# Patient Record
Sex: Female | Born: 1985 | Race: White | Hispanic: No | Marital: Single | Smoking: Never smoker
Health system: Southern US, Community
[De-identification: ages and names within clinical notes are randomized; demographics above are authoritative.]

## PROBLEM LIST (undated history)

## (undated) DIAGNOSIS — K219 Gastro-esophageal reflux disease without esophagitis: Secondary | ICD-10-CM

## (undated) DIAGNOSIS — T7840XA Allergy, unspecified, initial encounter: Secondary | ICD-10-CM

## (undated) DIAGNOSIS — R87810 Cervical high risk human papillomavirus (HPV) DNA test positive: Secondary | ICD-10-CM

## (undated) DIAGNOSIS — IMO0002 Reserved for concepts with insufficient information to code with codable children: Secondary | ICD-10-CM

## (undated) DIAGNOSIS — R87610 Atypical squamous cells of undetermined significance on cytologic smear of cervix (ASC-US): Secondary | ICD-10-CM

## (undated) DIAGNOSIS — S34101A Unspecified injury to L1 level of lumbar spinal cord, initial encounter: Secondary | ICD-10-CM

## (undated) HISTORY — DX: Allergy, unspecified, initial encounter: T78.40XA

## (undated) HISTORY — DX: Gastro-esophageal reflux disease without esophagitis: K21.9

## (undated) HISTORY — DX: Atypical squamous cells of undetermined significance on cytologic smear of cervix (ASC-US): R87.610

## (undated) HISTORY — DX: Cervical high risk human papillomavirus (HPV) DNA test positive: R87.810

## (undated) HISTORY — PX: COLPOSCOPY: SHX161

## (undated) HISTORY — DX: Reserved for concepts with insufficient information to code with codable children: IMO0002

## (undated) HISTORY — DX: Unspecified injury to L1 level of lumbar spinal cord, initial encounter: S34.101A

## (undated) HISTORY — PX: WISDOM TOOTH EXTRACTION: SHX21

---

## 2007-04-13 DIAGNOSIS — R87619 Unspecified abnormal cytological findings in specimens from cervix uteri: Secondary | ICD-10-CM

## 2007-04-13 DIAGNOSIS — IMO0002 Reserved for concepts with insufficient information to code with codable children: Secondary | ICD-10-CM

## 2007-04-13 HISTORY — DX: Unspecified abnormal cytological findings in specimens from cervix uteri: R87.619

## 2007-04-13 HISTORY — DX: Reserved for concepts with insufficient information to code with codable children: IMO0002

## 2007-10-27 ENCOUNTER — Other Ambulatory Visit: Admission: RE | Admit: 2007-10-27 | Discharge: 2007-10-27 | Payer: Self-pay | Admitting: Obstetrics & Gynecology

## 2008-03-28 ENCOUNTER — Other Ambulatory Visit: Admission: RE | Admit: 2008-03-28 | Discharge: 2008-03-28 | Payer: Self-pay | Admitting: Obstetrics and Gynecology

## 2008-08-26 ENCOUNTER — Other Ambulatory Visit: Admission: RE | Admit: 2008-08-26 | Discharge: 2008-08-26 | Payer: Self-pay | Admitting: Obstetrics and Gynecology

## 2009-03-19 ENCOUNTER — Other Ambulatory Visit: Admission: RE | Admit: 2009-03-19 | Discharge: 2009-03-19 | Payer: Self-pay | Admitting: Obstetrics and Gynecology

## 2009-08-26 ENCOUNTER — Inpatient Hospital Stay (HOSPITAL_COMMUNITY): Admission: EM | Admit: 2009-08-26 | Discharge: 2009-09-01 | Payer: Self-pay | Admitting: Emergency Medicine

## 2009-12-13 HISTORY — PX: CERVICAL BIOPSY  W/ LOOP ELECTRODE EXCISION: SUR135

## 2011-03-19 LAB — CBC
HCT: 41.1 % (ref 36.0–46.0)
Hemoglobin: 13.9 g/dL (ref 12.0–15.0)
Hemoglobin: 13.2 g/dL (ref 12.0–15.0)
MCHC: 33.8 g/dL (ref 30.0–36.0)
MCHC: 34.4 g/dL (ref 30.0–36.0)
RDW: 12.5 % (ref 11.5–15.5)
RDW: 12.4 % (ref 11.5–15.5)

## 2011-03-19 LAB — PROTIME-INR: Prothrombin Time: 13.6 seconds (ref 11.6–15.2)

## 2011-03-19 LAB — LACTIC ACID, PLASMA: Lactic Acid, Venous: 1.3 mmol/L (ref 0.5–2.2)

## 2011-12-15 ENCOUNTER — Ambulatory Visit: Payer: Self-pay

## 2012-06-13 ENCOUNTER — Telehealth: Payer: Self-pay

## 2012-06-13 NOTE — Telephone Encounter (Signed)
Request printed since these records were not done in Epic.

## 2012-06-13 NOTE — Telephone Encounter (Signed)
PT STATES SHE HAD SOME SHOT RECORDS DONE FROM [place] THAT WE DID AND SHE WOULD LIKE TO HAVE A PRINT OUT OF WHAT WAS DONE PLEASE CALL [phone]

## 2012-09-05 ENCOUNTER — Ambulatory Visit: Payer: Self-pay

## 2012-09-26 ENCOUNTER — Ambulatory Visit: Payer: Self-pay

## 2012-10-09 ENCOUNTER — Ambulatory Visit: Payer: Self-pay | Admitting: Family Medicine

## 2012-10-19 ENCOUNTER — Encounter: Payer: Self-pay | Admitting: Family Medicine

## 2012-10-19 ENCOUNTER — Ambulatory Visit (INDEPENDENT_AMBULATORY_CARE_PROVIDER_SITE_OTHER): Payer: 59 | Admitting: Family Medicine

## 2012-10-19 VITALS — BP 119/72 | HR 75 | Temp 98.3°F | Resp 16 | Ht 64.0 in | Wt 138.0 lb

## 2012-10-19 DIAGNOSIS — R590 Localized enlarged lymph nodes: Secondary | ICD-10-CM

## 2012-10-19 DIAGNOSIS — Z3042 Encounter for surveillance of injectable contraceptive: Secondary | ICD-10-CM | POA: Insufficient documentation

## 2012-10-19 DIAGNOSIS — H6690 Otitis media, unspecified, unspecified ear: Secondary | ICD-10-CM

## 2012-10-19 DIAGNOSIS — R599 Enlarged lymph nodes, unspecified: Secondary | ICD-10-CM

## 2012-10-19 DIAGNOSIS — Z0000 Encounter for general adult medical examination without abnormal findings: Secondary | ICD-10-CM | POA: Insufficient documentation

## 2012-10-19 DIAGNOSIS — H6692 Otitis media, unspecified, left ear: Secondary | ICD-10-CM

## 2012-10-19 LAB — COMPREHENSIVE METABOLIC PANEL
AST: 16 U/L (ref 0–37)
BUN: 10 mg/dL (ref 6–23)
Calcium: 9.4 mg/dL (ref 8.4–10.5)
Chloride: 106 mEq/L (ref 96–112)
Creat: 0.81 mg/dL (ref 0.50–1.10)
Glucose, Bld: 83 mg/dL (ref 70–99)

## 2012-10-19 LAB — CBC
HCT: 40.3 % (ref 36.0–46.0)
Hemoglobin: 14.3 g/dL (ref 12.0–15.0)
MCHC: 35.5 g/dL (ref 30.0–36.0)
RBC: 4.52 MIL/uL (ref 3.87–5.11)

## 2012-10-19 MED ORDER — AMOXICILLIN-POT CLAVULANATE 875-125 MG PO TABS: 1.0000 | ORAL_TABLET | Freq: Two times a day (BID) | ORAL | Status: DC

## 2012-10-19 NOTE — Patient Instructions (Addendum)
Lymphadenopathy Lymphadenopathy means "disease of the lymph glands." But the term is usually used to describe swollen or enlarged lymph glands, also called lymph nodes. These are the bean-shaped organs found in many locations including the neck, underarm, and groin. Lymph glands are part of the immune system, which fights infections in your body. Lymphadenopathy can occur in just one area of the body, such as the neck, or it can be generalized, with lymph node enlargement in several areas. The nodes found in the neck are the most common sites of lymphadenopathy. CAUSES  When your immune system responds to germs (such as viruses or bacteria ), infection-fighting cells and fluid build up. This causes the glands to grow in size. This is usually not something to worry about. Sometimes, the glands themselves can become infected and inflamed. This is called lymphadenitis. Enlarged lymph nodes can be caused by many diseases:  Bacterial disease, such as strep throat or a skin infection.  Viral disease, such as a common cold.  Other germs, such as lyme disease, tuberculosis, or sexually transmitted diseases.  Cancers, such as lymphoma (cancer of the lymphatic system) or leukemia (cancer of the white blood cells).  Inflammatory diseases such as lupus or rheumatoid arthritis.  Reactions to medications. Many of the diseases above are rare, but important. This is why you should see your caregiver if you have lymphadenopathy. SYMPTOMS   Swollen, enlarged lumps in the neck, back of the head or other locations.  Tenderness.  Warmth or redness of the skin over the lymph nodes.  Fever. DIAGNOSIS  Enlarged lymph nodes are often near the source of infection. They can help healthcare providers diagnose your illness. For instance:   Swollen lymph nodes around the jaw might be caused by an infection in the mouth.  Enlarged glands in the neck often signal a throat infection.  Lymph nodes that are swollen  in more than one area often indicate an illness caused by a virus. Your caregiver most likely will know what is causing your lymphadenopathy after listening to your history and examining you. Blood tests, x-rays or other tests may be needed. If the cause of the enlarged lymph node cannot be found, and it does not go away by itself, then a biopsy may be needed. Your caregiver will discuss this with you. TREATMENT  Treatment for your enlarged lymph nodes will depend on the cause. Many times the nodes will shrink to normal size by themselves, with no treatment. Antibiotics or other medicines may be needed for infection. Only take over-the-counter or prescription medicines for pain, discomfort or fever as directed by your caregiver. HOME CARE INSTRUCTIONS  Swollen lymph glands usually return to normal when the underlying medical condition goes away. If they persist, contact your health-care provider. He/she might prescribe antibiotics or other treatments, depending on the diagnosis. Take any medications exactly as prescribed. Keep any follow-up appointments made to check on the condition of your enlarged nodes.  SEEK MEDICAL CARE IF:   Swelling lasts for more than two weeks.  You have symptoms such as weight loss, night sweats, fatigue or fever that does not go away.  The lymph nodes are hard, seem fixed to the skin or are growing rapidly.  Skin over the lymph nodes is red and inflamed. This could mean there is an infection. SEEK IMMEDIATE MEDICAL CARE IF:   Fluid starts leaking from the area of the enlarged lymph node.  You develop a fever of 102 F (38.9 C) or greater.  Severe  pain develops (not necessarily at the site of a large lymph node).  You develop chest pain or shortness of breath.  You develop worsening abdominal pain. MAKE SURE YOU:   Understand these instructions.  Will watch your condition.  Will get help right away if you are not doing well or get worse. Document  Released: 09/07/2008 Document Revised: 02/21/2012 Document Reviewed: 09/07/2008 [facility] Patient Information 2013 [place], [place].   An OTC  Medication for pain and inflammation  ( like Aleve) can be helpful for reducing pain and swelling in neck. When you return, we may need to do a chest xray and consider a CT of the neck if you are not better.

## 2012-10-19 NOTE — Progress Notes (Signed)
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 She is otherwise asymptomatic. Her last PPD was negative in 2012; testing not done this year because of supply shortage. Pt denies fever/chills, sore throat, sinus pain or congestion, ear pain, HA, cough, SOB, night sweats, GU/GI problems, rash or easy bruising. She is in a monogamous relationship and is sexually active; she and her partner both tested HIV-negative ~ 2 years ago (she declines testing today). She has a healthy dog. She has possible work exposure to illness.  ROS: As per HPI.  O:  Filed Vitals:   10/19/12 1541  BP: 119/72  Pulse: 75  Temp: 98.3 F (36.8 C)  Resp: 16   GEN: In NAD: WN,WD. HEENT: [place]/AT; EOMI w/ clear conj/scl. EACs normal. R TM normal; L TM- erythema w/ scant effusion. Post ph erythematous w/o exudate. NT sinuses. NECK: Left posterior cervical chain is palpable and mildly tender. LYMPH: Axillary nodes not palpable. LUNGS: CTA w/o wheezes, rhonchi or rales. COR: RRR; No m/g/r. ABD: Normal appearance, flat w/o distention, guarding, HSM or masses. Minimal generalized tenderness. SKIN: W&D; no rashes or pallor. NEURO: A&O x 3; CNs intact. Nonfocal. Mildly anxious.  A/P:  1. Lymphadenopathy of left cervical region  Comprehensive metabolic panel, CBC, Sed Rate Take Aleve 1-2 tabs bid w/ food for pain  2. Otitis media, left  RX: Augmentin (generic) 875-125 mg 1 tab bid x 10 days

## 2012-10-20 ENCOUNTER — Encounter: Payer: Self-pay | Admitting: *Deleted

## 2012-10-20 NOTE — Progress Notes (Signed)
Quick Note:  Please notify pt that results are normal.   Provide pt with copy of labs. ______ 

## 2012-10-26 ENCOUNTER — Ambulatory Visit: Payer: 59 | Admitting: Family Medicine

## 2013-03-01 ENCOUNTER — Ambulatory Visit (INDEPENDENT_AMBULATORY_CARE_PROVIDER_SITE_OTHER): Payer: 59 | Admitting: Family Medicine

## 2013-03-01 VITALS — BP 108/62 | HR 92 | Temp 98.6°F | Resp 16 | Ht 64.0 in | Wt 137.0 lb

## 2013-03-01 DIAGNOSIS — K5289 Other specified noninfective gastroenteritis and colitis: Secondary | ICD-10-CM

## 2013-03-01 DIAGNOSIS — R112 Nausea with vomiting, unspecified: Secondary | ICD-10-CM

## 2013-03-01 DIAGNOSIS — R197 Diarrhea, unspecified: Secondary | ICD-10-CM

## 2013-03-01 DIAGNOSIS — K529 Noninfective gastroenteritis and colitis, unspecified: Secondary | ICD-10-CM

## 2013-03-01 DIAGNOSIS — R109 Unspecified abdominal pain: Secondary | ICD-10-CM

## 2013-03-01 MED ORDER — LOPERAMIDE HCL 2 MG PO TABS: 2.0000 mg | ORAL_TABLET | Freq: Four times a day (QID) | ORAL | Status: DC | PRN

## 2013-03-01 MED ORDER — ONDANSETRON HCL 8 MG PO TABS: 8.0000 mg | ORAL_TABLET | Freq: Three times a day (TID) | ORAL | Status: DC | PRN

## 2013-03-01 MED ORDER — PROMETHAZINE HCL 12.5 MG PO TABS: 25.0000 mg | ORAL_TABLET | Freq: Four times a day (QID) | ORAL | Status: DC | PRN

## 2013-03-01 MED ORDER — PROMETHAZINE HCL 25 MG PO TABS: 25.0000 mg | ORAL_TABLET | Freq: Three times a day (TID) | ORAL | Status: DC | PRN

## 2013-03-01 MED ORDER — PROMETHAZINE HCL 25 MG/ML IJ SOLN
25.0000 mg | Freq: Once | INTRAMUSCULAR | Status: AC
  Administered 2013-03-01: 25 mg via INTRAMUSCULAR

## 2013-03-01 MED ORDER — PROMETHAZINE HCL 25 MG RE SUPP: 25.0000 mg | Freq: Four times a day (QID) | RECTAL | Status: DC | PRN

## 2013-03-01 NOTE — Patient Instructions (Signed)
Viral Gastroenteritis Viral gastroenteritis is also known as stomach flu. This condition affects the stomach and intestinal tract. It can cause sudden diarrhea and vomiting. The illness typically lasts 3 to 8 days. Most people develop an immune response that eventually gets rid of the virus. While this natural response develops, the virus can make you quite ill. CAUSES  Many different viruses can cause gastroenteritis, such as rotavirus or noroviruses. You can catch one of these viruses by consuming contaminated food or water. You may also catch a virus by sharing utensils or other personal items with an infected person or by touching a contaminated surface. SYMPTOMS  The most common symptoms are diarrhea and vomiting. These problems can cause a severe loss of body fluids (dehydration) and a body salt (electrolyte) imbalance. Other symptoms may include:  Fever.  Headache.  Fatigue.  Abdominal pain. DIAGNOSIS  Your caregiver can usually diagnose viral gastroenteritis based on your symptoms and a physical exam. A stool sample may also be taken to test for the presence of viruses or other infections. TREATMENT  This illness typically goes away on its own. Treatments are aimed at rehydration. The most serious cases of viral gastroenteritis involve vomiting so severely that you are not able to keep fluids down. In these cases, fluids must be given through an intravenous line (IV). HOME CARE INSTRUCTIONS   Drink enough fluids to keep your urine clear or pale yellow. Drink small amounts of fluids frequently and increase the amounts as tolerated.  Ask your caregiver for specific rehydration instructions.  Avoid:  Foods high in sugar.  Alcohol.  Carbonated drinks.  Tobacco.  Juice.  Caffeine drinks.  Extremely hot or cold fluids.  Fatty, greasy foods.  Too much intake of anything at one time.  Dairy products until 24 to 48 hours after diarrhea stops.  You may consume probiotics.  Probiotics are active cultures of beneficial bacteria. They may lessen the amount and number of diarrheal stools in adults. Probiotics can be found in yogurt with active cultures and in supplements.  Wash your hands well to avoid spreading the virus.  Only take over-the-counter or prescription medicines for pain, discomfort, or fever as directed by your caregiver. Do not give aspirin to children. Antidiarrheal medicines are not recommended.  Ask your caregiver if you should continue to take your regular prescribed and over-the-counter medicines.  Keep all follow-up appointments as directed by your caregiver. SEEK IMMEDIATE MEDICAL CARE IF:   You are unable to keep fluids down.  You do not urinate at least once every 6 to 8 hours.  You develop shortness of breath.  You notice blood in your stool or vomit. This may look like coffee grounds.  You have abdominal pain that increases or is concentrated in one small area (localized).  You have persistent vomiting or diarrhea.  You have a fever.  The patient is a child younger than 3 months, and he or she has a fever.  The patient is a child older than 3 months, and he or she has a fever and persistent symptoms.  The patient is a child older than 3 months, and he or she has a fever and symptoms suddenly get worse.  The patient is a baby, and he or she has no tears when crying. MAKE SURE YOU:   Understand these instructions.  Will watch your condition.  Will get help right away if you are not doing well or get worse. Document Released: 11/29/2005 Document Revised: 02/21/2012 Document Reviewed: 09/15/2011   [employer] Patient Information 2013 [employer].  

## 2013-03-01 NOTE — Progress Notes (Signed)
Subjective:    Patient ID: Kathleen Walters, female    DOB: 08-03-1986, 27 y.o.   MRN: [id number] Chief Complaint  Patient presents with  . Diarrhea    Since 530a.m  . Nausea  . Vomiting    HPI  Woke up with stomach pain and had watery diarrhea all day. No fevers or chills, stomach pain is generalized.  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  No h/o any abodminal surgeries.   Taken some aleve - tried to some take nausea liquid otc but couldn't stay down.  Hasn't peed since this morning and has only been able to keep a few sips of fluid down.  Past Medical History  Diagnosis Date  . Allergy   . GERD (gastroesophageal reflux disease)    Current Outpatient Prescriptions on File Prior to Visit  Medication Sig Dispense Refill  . medroxyPROGESTERone (DEPO-PROVERA) 150 MG/ML injection Inject 150 mg into the muscle every 3 (three) months.       No current facility-administered medications on file prior to visit.   No Known Allergies   Review of Systems  Constitutional: Positive for activity change, appetite change and fatigue. Negative for fever, chills, diaphoresis and unexpected weight change.  Respiratory: Negative for shortness of breath.   Cardiovascular: Negative for chest pain and leg swelling.  Gastrointestinal: Positive for nausea, vomiting, abdominal pain and diarrhea. Negative for constipation, blood in stool, abdominal distention, anal bleeding and rectal pain.  Genitourinary: Negative for dysuria, decreased urine volume and difficulty urinating.  Musculoskeletal: Negative for gait problem.  Skin: Negative for rash.  Hematological: Negative for adenopathy.  Psychiatric/Behavioral: Negative for sleep disturbance.      BP 108/62  Pulse 92  Temp(Src) 98.6 F (37 C)  Resp 16  Ht 5\' 4"  (1.626 m)  Wt 137 lb (62.143 kg)  BMI 23.5 kg/m2 Objective:   Physical Exam  Constitutional: She is oriented to person, place, and time. She  appears well-developed and well-nourished. No distress.  HENT:  Head: Normocephalic and atraumatic.  Neck: Normal range of motion. Neck supple. No thyromegaly present.  Cardiovascular: Normal rate, regular rhythm, normal heart sounds and intact distal pulses.   Pulmonary/Chest: Effort normal and breath sounds normal. No respiratory distress.  Abdominal: Soft. Bowel sounds are normal. There is generalized tenderness. There is no rigidity, no rebound, no guarding, no CVA tenderness, no tenderness at McBurney's point and negative Murphy's sign. No hernia.  Musculoskeletal: She exhibits no edema.  Lymphadenopathy:    She has no cervical adenopathy.  Neurological: She is alert and oriented to person, place, and time.  Skin: Skin is warm and dry. She is not diaphoretic. No erythema.  Psychiatric: She has a normal mood and affect. Her behavior is normal.          Assessment & Plan:  Diarrhea  Abdominal pain, unspecified site  Nausea with vomiting - Plan: promethazine (PHENERGAN) injection 25 mg, DISCONTINUED: promethazine (PHENERGAN) tablet 25 mg  Gastroenteritis, acute - due to its acute onset and benign exam I suspect this is viral.  Pt offered IVF but declines - thinks if she can just get ahead of the vomiting with medication that she can catch up on her hydration so given 25mg  of IM promethazine in office. She was able to tolerate a few sips of fluid so sent her home w/ po and pr promethazine per pt request. Also provided written rx for zofran and imodium as pt has  to work in 1d and shouldn't take phenergan while working. If worsens, RTC for further eval.  Meds ordered this encounter  Medications  . DISCONTD: promethazine (PHENERGAN) tablet 25 mg    Sig:   . promethazine (PHENERGAN) injection 25 mg    Sig:   . promethazine (PHENERGAN) 25 MG tablet    Sig: Take 1 tablet (25 mg total) by mouth every 8 (eight) hours as needed for nausea.    Dispense:  15 tablet    Refill:  0  .  promethazine (PHENERGAN) 25 MG suppository    Sig: Place 1 suppository (25 mg total) rectally every 6 (six) hours as needed for nausea.    Dispense:  6 each    Refill:  0  . ondansetron (ZOFRAN) 8 MG tablet    Sig: Take 1 tablet (8 mg total) by mouth every 8 (eight) hours as needed for nausea.    Dispense:  10 tablet    Refill:  0  . loperamide (IMODIUM A-D) 2 MG tablet    Sig: Take 1 tablet (2 mg total) by mouth 4 (four) times daily as needed for diarrhea or loose stools.    Dispense:  30 tablet    Refill:  0

## 2013-04-20 ENCOUNTER — Telehealth: Payer: Self-pay | Admitting: Certified Nurse Midwife

## 2013-04-20 NOTE — Telephone Encounter (Signed)
RC from pt.  Advised we now have supply of Depo in office and she does not need RX from pharmacy unless she would like to do it that way.  She declines refill and will use office stock.

## 2013-04-20 NOTE — Telephone Encounter (Signed)
I have attempted to contact this patient by phone with the following results: left message to return my call on answering machine (home/mobile).  

## 2013-04-20 NOTE — Telephone Encounter (Signed)
Needs depo called into [facility] on [address]

## 2013-04-25 ENCOUNTER — Ambulatory Visit (INDEPENDENT_AMBULATORY_CARE_PROVIDER_SITE_OTHER): Payer: 59 | Admitting: Certified Nurse Midwife

## 2013-04-25 VITALS — BP 108/60 | HR 68 | Resp 12 | Wt 137.4 lb

## 2013-04-25 DIAGNOSIS — Z3040 Encounter for surveillance of contraceptives, unspecified: Secondary | ICD-10-CM

## 2013-04-25 MED ORDER — MEDROXYPROGESTERONE ACETATE 150 MG/ML IM SUSP
150.0000 mg | Freq: Once | INTRAMUSCULAR | Status: AC
  Administered 2013-04-25: 150 mg via INTRAMUSCULAR

## 2013-04-25 NOTE — Progress Notes (Signed)
Depo provera 150mg  given in pts left glute, IM. Pt tolerated injection without any problems.

## 2013-05-21 ENCOUNTER — Ambulatory Visit: Payer: 59 | Admitting: Certified Nurse Midwife

## 2013-06-28 ENCOUNTER — Encounter: Payer: Self-pay | Admitting: Certified Nurse Midwife

## 2013-07-10 ENCOUNTER — Other Ambulatory Visit: Payer: Self-pay | Admitting: Certified Nurse Midwife

## 2013-07-10 ENCOUNTER — Encounter: Payer: Self-pay | Admitting: Certified Nurse Midwife

## 2013-07-10 ENCOUNTER — Ambulatory Visit (INDEPENDENT_AMBULATORY_CARE_PROVIDER_SITE_OTHER): Payer: 59 | Admitting: Certified Nurse Midwife

## 2013-07-10 VITALS — BP 90/60 | HR 64 | Resp 16 | Ht 63.25 in | Wt 145.0 lb

## 2013-07-10 DIAGNOSIS — Z01419 Encounter for gynecological examination (general) (routine) without abnormal findings: Secondary | ICD-10-CM

## 2013-07-10 DIAGNOSIS — Z309 Encounter for contraceptive management, unspecified: Secondary | ICD-10-CM

## 2013-07-10 DIAGNOSIS — Z0000 Encounter for general adult medical examination without abnormal findings: Secondary | ICD-10-CM

## 2013-07-10 DIAGNOSIS — IMO0001 Reserved for inherently not codable concepts without codable children: Secondary | ICD-10-CM

## 2013-07-10 LAB — HEMOGLOBIN, FINGERSTICK: Hemoglobin, fingerstick: 13.8 g/dL (ref 12.0–16.0)

## 2013-07-10 LAB — POCT URINALYSIS DIPSTICK
Bilirubin, UA: NEGATIVE
Glucose, UA: NEGATIVE
Ketones, UA: NEGATIVE
Leukocytes, UA: NEGATIVE
Nitrite, UA: NEGATIVE
pH, UA: 5

## 2013-07-10 MED ORDER — MEDROXYPROGESTERONE ACETATE 150 MG/ML IM SUSP
150.0000 mg | Freq: Once | INTRAMUSCULAR | Status: AC
  Administered 2013-07-10: 150 mg via INTRAMUSCULAR

## 2013-07-10 NOTE — Patient Instructions (Signed)
General topics  Next pap or exam is  due in 1 year Take a Women's multivitamin Take 1200 mg. of calcium daily - prefer dietary If any concerns in interim to call back  Breast Self-Awareness Practicing breast self-awareness may pick up problems early, prevent significant medical complications, and possibly save your life. By practicing breast self-awareness, you can become familiar with how your breasts look and feel and if your breasts are changing. This allows you to notice changes early. It can also offer you some reassurance that your breast health is good. One way to learn what is normal for your breasts and whether your breasts are changing is to do a breast self-exam. If you find a lump or something that was not present in the past, it is best to contact your caregiver right away. Other findings that should be evaluated by your caregiver include nipple discharge, especially if it is bloody; skin changes or reddening; areas where the skin seems to be pulled in (retracted); or new lumps and bumps. Breast pain is seldom associated with cancer (malignancy), but should also be evaluated by a caregiver. BREAST SELF-EXAM The best time to examine your breasts is 5 7 days after your menstrual period is over.  ExitCare Patient Information 2013 [employer].   Exercise to Stay Healthy Exercise helps you become and stay healthy. EXERCISE IDEAS AND TIPS Choose exercises that:  You enjoy.  Fit into your day. You do not need to exercise really hard to be healthy. You can do exercises at a slow or medium level and stay healthy. You can:  Stretch before and after working out.  Try yoga, Pilates, or tai chi.  Lift weights.  Walk fast, swim, jog, run, climb stairs, bicycle, dance, or rollerskate.  Take aerobic classes. Exercises that burn about 150 calories:  Running 1  miles in 15 minutes.  Playing volleyball for 45 to 60 minutes.  Washing and waxing a car for 45 to 60  minutes.  Playing touch football for 45 minutes.  Walking 1  miles in 35 minutes.  Pushing a stroller 1  miles in 30 minutes.  Playing basketball for 30 minutes.  Raking leaves for 30 minutes.  Bicycling 5 miles in 30 minutes.  Walking 2 miles in 30 minutes.  Dancing for 30 minutes.  Shoveling snow for 15 minutes.  Swimming laps for 20 minutes.  Walking up stairs for 15 minutes.  Bicycling 4 miles in 15 minutes.  Gardening for 30 to 45 minutes.  Jumping rope for 15 minutes.  Washing windows or floors for 45 to 60 minutes. Document Released: 01/01/2011 Document Revised: 02/21/2012 Document Reviewed: 01/01/2011 ExitCare Patient Information 2013 [employer].   Other topics ( that may be useful information):    Sexually Transmitted Disease Sexually transmitted disease (STD) refers to any infection that is passed from person to person during sexual activity. This may happen by way of saliva, semen, blood, vaginal mucus, or urine. Common STDs include:  Gonorrhea.  Chlamydia.  Syphilis.  HIV/AIDS.  Genital herpes.  Hepatitis B and C.  Trichomonas.  Human papillomavirus (HPV).  Pubic lice. CAUSES  An STD may be spread by bacteria, virus, or parasite. A person can get an STD by:  Sexual intercourse with an infected person.  Sharing sex toys with an infected person.  Sharing needles with an infected person.  Having intimate contact with the genitals, mouth, or rectal areas of an infected person. SYMPTOMS  Some people may not have any symptoms, but   they can still pass the infection to others. Different STDs have different symptoms. Symptoms include:  Painful or bloody urination.  Pain in the pelvis, abdomen, vagina, anus, throat, or eyes.  Skin rash, itching, irritation, growths, or sores (lesions). These usually occur in the genital or anal area.  Abnormal vaginal discharge.  Penile discharge in men.  Soft, flesh-colored skin growths in the  genital or anal area.  Fever.  Pain or bleeding during sexual intercourse.  Swollen glands in the groin area.  Yellow skin and eyes (jaundice). This is seen with hepatitis. DIAGNOSIS  To make a diagnosis, your caregiver may:  Take a medical history.  Perform a physical exam.  Take a specimen (culture) to be examined.  Examine a sample of discharge under a microscope.  Perform blood test TREATMENT   Chlamydia, gonorrhea, trichomonas, and syphilis can be cured with antibiotic medicine.  Genital herpes, hepatitis, and HIV can be treated, but not cured, with prescribed medicines. The medicines will lessen the symptoms.  Genital warts from HPV can be treated with medicine or by freezing, burning (electrocautery), or surgery. Warts may come back.  HPV is a virus and cannot be cured with medicine or surgery.However, abnormal areas may be followed very closely by your caregiver and may be removed from the cervix, vagina, or vulva through office procedures or surgery. If your diagnosis is confirmed, your recent sexual partners need treatment. This is true even if they are symptom-free or have a negative culture or evaluation. They should not have sex until their caregiver says it is okay. HOME CARE INSTRUCTIONS  All sexual partners should be informed, tested, and treated for all STDs.  Take your antibiotics as directed. Finish them even if you start to feel better.  Only take over-the-counter or prescription medicines for pain, discomfort, or fever as directed by your caregiver.  Rest.  Eat a balanced diet and drink enough fluids to keep your urine clear or pale yellow.  Do not have sex until treatment is completed and you have followed up with your caregiver. STDs should be checked after treatment.  Keep all follow-up appointments, Pap tests, and blood tests as directed by your caregiver.  Only use latex condoms and water-soluble lubricants during sexual activity. Do not use  petroleum jelly or oils.  Avoid alcohol and illegal drugs.  Get vaccinated for HPV and hepatitis. If you have not received these vaccines in the past, talk to your caregiver about whether one or both might be right for you.  Avoid risky sex practices that can break the skin. The only way to avoid getting an STD is to avoid all sexual activity.Latex condoms and dental dams (for oral sex) will help lessen the risk of getting an STD, but will not completely eliminate the risk. SEEK MEDICAL CARE IF:   You have a fever.  You have any new or worsening symptoms. Document Released: 02/19/2003 Document Revised: 02/21/2012 Document Reviewed: 02/26/2011 ExitCare Patient Information 2013 [employer].    Domestic Abuse You are being battered or abused if someone close to you hits, pushes, or physically hurts you in any way. You also are being abused if you are forced into activities. You are being sexually abused if you are forced to have sexual contact of any kind. You are being emotionally abused if you are made to feel worthless or if you are constantly threatened. It is important to remember that help is available. No one has the right to abuse you. PREVENTION OF FURTHER   ABUSE  Learn the warning signs of danger. This varies with situations but may include: the use of alcohol, threats, isolation from friends and family, or forced sexual contact. Leave if you feel that violence is going to occur.  If you are attacked or beaten, report it to the police so the abuse is documented. You do not have to press charges. The police can protect you while you or the attackers are leaving. Get the officer's name and badge number and a copy of the report.  Find someone you can trust and tell them what is happening to you: your caregiver, a nurse, clergy member, close friend or family member. Feeling ashamed is natural, but remember that you have done nothing wrong. No one deserves abuse. Document Released:  11/26/2000 Document Revised: 02/21/2012 Document Reviewed: 02/04/2011 ExitCare Patient Information 2013 [employer].    How Much is Too Much Alcohol? Drinking too much alcohol can cause injury, accidents, and health problems. These types of problems can include:   Car crashes.  Falls.  Family fighting (domestic violence).  Drowning.  Fights.  Injuries.  Burns.  Damage to certain organs.  Having a baby with birth defects. ONE DRINK CAN BE TOO MUCH WHEN YOU ARE:  Working.  Pregnant or breastfeeding.  Taking medicines. Ask your doctor.  Driving or planning to drive. If you or someone you know has a drinking problem, get help from a doctor.  Document Released: 09/25/2009 Document Revised: 02/21/2012 Document Reviewed: 09/25/2009 ExitCare Patient Information 2013 [employer].   Smoking Hazards Smoking cigarettes is extremely bad for your health. Tobacco smoke has over 200 known poisons in it. There are over 60 chemicals in tobacco smoke that cause cancer. Some of the chemicals found in cigarette smoke include:   Cyanide.  Benzene.  Formaldehyde.  Methanol (wood alcohol).  Acetylene (fuel used in welding torches).  Ammonia. Cigarette smoke also contains the poisonous gases nitrogen oxide and carbon monoxide.  Cigarette smokers have an increased risk of many serious medical problems and Smoking causes approximately:  90% of all lung cancer deaths in men.  80% of all lung cancer deaths in women.  90% of deaths from chronic obstructive lung disease. Compared with nonsmokers, smoking increases the risk of:  Coronary heart disease by 2 to 4 times.  Stroke by 2 to 4 times.  Men developing lung cancer by 23 times.  Women developing lung cancer by 13 times.  Dying from chronic obstructive lung diseases by 12 times.  . Smoking is the most preventable cause of death and disease in our society.  WHY IS SMOKING ADDICTIVE?  Nicotine is the chemical  agent in tobacco that is capable of causing addiction or dependence.  When you smoke and inhale, nicotine is absorbed rapidly into the bloodstream through your lungs. Nicotine absorbed through the lungs is capable of creating a powerful addiction. Both inhaled and non-inhaled nicotine may be addictive.  Addiction studies of cigarettes and spit tobacco show that addiction to nicotine occurs mainly during the teen years, when young people begin using tobacco products. WHAT ARE THE BENEFITS OF QUITTING?  There are many health benefits to quitting smoking.   Likelihood of developing cancer and heart disease decreases. Health improvements are seen almost immediately.  Blood pressure, pulse rate, and breathing patterns start returning to normal soon after quitting. QUITTING SMOKING   American Lung Association - 1-800-LUNGUSA  American Cancer Society - 1-800-ACS-2345 Document Released: 01/06/2005 Document Revised: 02/21/2012 Document Reviewed: 09/10/2009 ExitCare Patient Information 2013 [employer].   Stress Management Stress is a state of physical or mental tension that often results from changes in your life or normal routine. Some common causes of stress are:  Death of a loved one.  Injuries or severe illnesses.  Getting fired or changing jobs.  Moving into a new home. Other causes may be:  Sexual problems.  Business or financial losses.  Taking on a large debt.  Regular conflict with someone at home or at work.  Constant tiredness from lack of sleep. It is not just bad things that are stressful. It may be stressful to:  Win the lottery.  Get married.  Buy a new car. The amount of stress that can be easily tolerated varies from person to person. Changes generally cause stress, regardless of the types of change. Too much stress can affect your health. It may lead to physical or emotional problems. Too little stress (boredom) may also become stressful. SUGGESTIONS TO  REDUCE STRESS:  Talk things over with your family and friends. It often is helpful to share your concerns and worries. If you feel your problem is serious, you may want to get help from a professional counselor.  Consider your problems one at a time instead of lumping them all together. Trying to take care of everything at once may seem impossible. List all the things you need to do and then start with the most important one. Set a goal to accomplish 2 or 3 things each day. If you expect to do too many in a single day you will naturally fail, causing you to feel even more stressed.  Do not use alcohol or drugs to relieve stress. Although you may feel better for a short time, they do not remove the problems that caused the stress. They can also be habit forming.  Exercise regularly - at least 3 times per week. Physical exercise can help to relieve that "uptight" feeling and will relax you.  The shortest distance between despair and hope is often a good night's sleep.  Go to bed and get up on time allowing yourself time for appointments without being rushed.  Take a short "time-out" period from any stressful situation that occurs during the day. Close your eyes and take some deep breaths. Starting with the muscles in your face, tense them, hold it for a few seconds, then relax. Repeat this with the muscles in your neck, shoulders, hand, stomach, back and legs.  Take good care of yourself. Eat a balanced diet and get plenty of rest.  Schedule time for having fun. Take a break from your daily routine to relax. HOME CARE INSTRUCTIONS   Call if you feel overwhelmed by your problems and feel you can no longer manage them on your own.  Return immediately if you feel like hurting yourself or someone else. Document Released: 05/25/2001 Document Revised: 02/21/2012 Document Reviewed: 01/15/2008 ExitCare Patient Information 2013 [employer].   
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
## 2013-07-12 ENCOUNTER — Encounter: Payer: Self-pay | Admitting: Certified Nurse Midwife

## 2013-07-12 NOTE — Progress Notes (Signed)
Note reviewed, agree with plan.  Makyle Eslick, MD  

## 2013-07-13 LAB — IPS PAP TEST WITH HPV

## 2013-07-17 ENCOUNTER — Other Ambulatory Visit: Payer: Self-pay | Admitting: Certified Nurse Midwife

## 2013-07-17 DIAGNOSIS — N871 Moderate cervical dysplasia: Secondary | ICD-10-CM

## 2013-07-18 LAB — IPS HPV GENOTYPING 16/18

## 2013-07-23 ENCOUNTER — Telehealth: Payer: Self-pay | Admitting: Orthopedic Surgery

## 2013-07-23 ENCOUNTER — Other Ambulatory Visit: Payer: Self-pay | Admitting: Obstetrics & Gynecology

## 2013-07-23 DIAGNOSIS — IMO0002 Reserved for concepts with insufficient information to code with codable children: Secondary | ICD-10-CM

## 2013-07-23 NOTE — Telephone Encounter (Signed)
Spoke with pt about Pap results. Advised DL recommended colposcopy with SM due to the + HR HPV and her hx of CIN 2 and LEEP. Offered appt 07-27-13. Pt reports she has to work. Pt then will be out of town until the 24th. Pt requesting appt 08-06-13 as she is off work. Advised pt to take 800 mg of ibuprofen with food an hour before the appt. Pt is on Depo and does not have periods.

## 2013-07-23 NOTE — Telephone Encounter (Signed)
LMTCB re: Pap results.

## 2013-07-23 NOTE — Telephone Encounter (Signed)
Message copied by Alfredo Batty on Mon Jul 23, 2013  9:46 AM ------      Message from: Verner Chol      Created: Mon Jul 23, 2013  9:06 AM       Notify patient Pap smear negative, but HPVHR positive. Due to positive HRHPV colposcopy needed again due to previous history of CIN 2 and LEEP      Colposcopy to be scheduled with Dr. Hyacinth Meeker ------

## 2013-07-23 NOTE — Telephone Encounter (Signed)
Message copied by Alfredo Batty on Mon Jul 23, 2013  9:29 AM ------      Message from: Verner Chol      Created: Mon Jul 23, 2013  9:06 AM       Notify patient Pap smear negative, but HPVHR positive. Due to positive HRHPV colposcopy needed again due to previous history of CIN 2 and LEEP      Colposcopy to be scheduled with Dr. Hyacinth Meeker ------

## 2013-08-06 ENCOUNTER — Encounter: Payer: Self-pay | Admitting: Obstetrics & Gynecology

## 2013-08-06 ENCOUNTER — Ambulatory Visit (INDEPENDENT_AMBULATORY_CARE_PROVIDER_SITE_OTHER): Payer: 59 | Admitting: Obstetrics & Gynecology

## 2013-08-06 ENCOUNTER — Ambulatory Visit (INDEPENDENT_AMBULATORY_CARE_PROVIDER_SITE_OTHER): Payer: 59

## 2013-08-06 VITALS — BP 110/62 | HR 68 | Resp 12 | Ht 63.5 in | Wt 144.6 lb

## 2013-08-06 DIAGNOSIS — B977 Papillomavirus as the cause of diseases classified elsewhere: Secondary | ICD-10-CM

## 2013-08-06 DIAGNOSIS — IMO0002 Reserved for concepts with insufficient information to code with codable children: Secondary | ICD-10-CM

## 2013-08-06 DIAGNOSIS — R6889 Other general symptoms and signs: Secondary | ICD-10-CM

## 2013-08-06 NOTE — Patient Instructions (Signed)

## 2013-08-06 NOTE — Progress Notes (Addendum)
Subjective:     Patient ID: Kathleen Walters, female   DOB: 1986/01/27, 27 y.o.   MRN: [id number]  HPI 27 yo G0 here for colposcopy due to normal Pap but + HR HPV.  LEEP 10/11 with CIN 2.  Biopsies prior to LEEP showed CIN 3.  Paps have been normal since.  This is first Pap with HPV testing to be done.  Questions about Paps answered.    Review of Systems  All other systems reviewed and are negative.       Objective:   Physical Exam  Genitourinary:      Patient's last menstrual period was 12/13/2012.  Contraception:  Depo   Blood pressure 110/62, pulse 68, resp. rate 12, height 5' 3.5" (1.613 m), weight 144 lb 9.6 oz (65.59 kg), last menstrual period 12/13/2012.  Speculum inserted atraumatically and cervix visualized.  3% acetic acid applied.  Cervix examined using 3.75 and 7.5   X magnification and green filter.    Gross appearance:normal.  Squamocolumnar junction seen in entirety: yes.  No visible lesions.  Cervix swabbed with Lugol's solution.  Extent of lesion entirely seen: n/a.  No biopsies obtained.  ECC obtained.  Patient tolerated procedure well.     Assessment:     Normal Pap with + HR HPV     Plan:     ECC pending.  If normal, follow-up one year.      08/10/13-ECC neg.  F/U 1 yr.  MSM

## 2013-09-26 ENCOUNTER — Ambulatory Visit: Payer: 59

## 2013-09-27 ENCOUNTER — Ambulatory Visit (INDEPENDENT_AMBULATORY_CARE_PROVIDER_SITE_OTHER): Payer: 59 | Admitting: *Deleted

## 2013-09-27 VITALS — BP 110/60 | HR 68 | Resp 16 | Wt 145.0 lb

## 2013-09-27 DIAGNOSIS — Z3040 Encounter for surveillance of contraceptives, unspecified: Secondary | ICD-10-CM

## 2013-09-27 MED ORDER — MEDROXYPROGESTERONE ACETATE 150 MG/ML IM SUSP
150.0000 mg | Freq: Once | INTRAMUSCULAR | Status: AC
  Administered 2013-09-27: 150 mg via INTRAMUSCULAR

## 2013-12-14 ENCOUNTER — Ambulatory Visit: Payer: 59

## 2013-12-19 ENCOUNTER — Other Ambulatory Visit: Payer: 59

## 2013-12-19 ENCOUNTER — Ambulatory Visit (INDEPENDENT_AMBULATORY_CARE_PROVIDER_SITE_OTHER): Payer: 59 | Admitting: *Deleted

## 2013-12-19 VITALS — BP 102/60 | HR 84 | Resp 18 | Wt 146.0 lb

## 2013-12-19 DIAGNOSIS — Z3040 Encounter for surveillance of contraceptives, unspecified: Secondary | ICD-10-CM

## 2013-12-19 MED ORDER — MEDROXYPROGESTERONE ACETATE 150 MG/ML IM SUSP
150.0000 mg | Freq: Once | INTRAMUSCULAR | Status: AC
  Administered 2013-12-19: 150 mg via INTRAMUSCULAR

## 2013-12-19 NOTE — Progress Notes (Signed)
Pt tolerated Depo Provera Injection well, Next Depo Provera injection due 3/25-03/20/14

## 2014-01-19 ENCOUNTER — Encounter (HOSPITAL_BASED_OUTPATIENT_CLINIC_OR_DEPARTMENT_OTHER): Payer: Self-pay | Admitting: Emergency Medicine

## 2014-01-19 ENCOUNTER — Emergency Department (HOSPITAL_BASED_OUTPATIENT_CLINIC_OR_DEPARTMENT_OTHER)
Admission: EM | Admit: 2014-01-19 | Discharge: 2014-01-19 | Disposition: A | Discharge status: Home / Self Care | Payer: 59 | Attending: Emergency Medicine | Admitting: Emergency Medicine

## 2014-01-19 DIAGNOSIS — M792 Neuralgia and neuritis, unspecified: Secondary | ICD-10-CM

## 2014-01-19 DIAGNOSIS — B029 Zoster without complications: Secondary | ICD-10-CM | POA: Insufficient documentation

## 2014-01-19 DIAGNOSIS — Z8719 Personal history of other diseases of the digestive system: Secondary | ICD-10-CM | POA: Insufficient documentation

## 2014-01-19 DIAGNOSIS — G589 Mononeuropathy, unspecified: Secondary | ICD-10-CM | POA: Insufficient documentation

## 2014-01-19 DIAGNOSIS — Z87828 Personal history of other (healed) physical injury and trauma: Secondary | ICD-10-CM | POA: Insufficient documentation

## 2014-01-19 MED ORDER — OXYCODONE-ACETAMINOPHEN 5-325 MG PO TABS
2.0000 | ORAL_TABLET | Freq: Once | ORAL | Status: AC
  Administered 2014-01-19: 2 via ORAL
  Filled 2014-01-19: qty 2

## 2014-01-19 MED ORDER — LORAZEPAM 1 MG PO TABS
1.0000 mg | ORAL_TABLET | Freq: Once | ORAL | Status: AC
  Administered 2014-01-19: 1 mg via ORAL
  Filled 2014-01-19: qty 1

## 2014-01-19 MED ORDER — OXYCODONE-ACETAMINOPHEN 5-325 MG PO TABS: 2.0000 | ORAL_TABLET | ORAL | Status: DC | PRN
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
## 2014-01-19 NOTE — ED Notes (Signed)
Onset Thursday night of right low back pain radiating into her right leg.  History of shingles and states this feels like the radiating nerve pain.  No active blistering at this time.

## 2014-01-19 NOTE — Discharge Instructions (Signed)
Take Percocet as needed for pain. Return to the ED with worsening or concerning symptoms.

## 2014-01-19 NOTE — ED Notes (Signed)
Patient did get a prescription for Valtrex called in by her PMD.  However, needs assistance with pain medication.  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

## 2014-01-20 NOTE — ED Provider Notes (Signed)
Medical screening examination/treatment/procedure(s) were performed by non-physician practitioner and as supervising physician I was immediately available for consultation/collaboration.  EKG Interpretation   None         Nechuma Boven S Jaber Dunlow, MD 01/20/14 1222 

## 2014-01-22 ENCOUNTER — Ambulatory Visit (INDEPENDENT_AMBULATORY_CARE_PROVIDER_SITE_OTHER): Payer: 59 | Admitting: Family Medicine

## 2014-01-22 ENCOUNTER — Ambulatory Visit: Payer: 59

## 2014-01-22 VITALS — BP 122/80 | HR 96 | Temp 98.6°F | Resp 18 | Wt 147.0 lb

## 2014-01-22 DIAGNOSIS — M79609 Pain in unspecified limb: Secondary | ICD-10-CM

## 2014-01-22 DIAGNOSIS — M79604 Pain in right leg: Secondary | ICD-10-CM

## 2014-01-22 DIAGNOSIS — M5431 Sciatica, right side: Secondary | ICD-10-CM

## 2014-01-22 DIAGNOSIS — M5430 Sciatica, unspecified side: Secondary | ICD-10-CM

## 2014-01-22 MED ORDER — OXYCODONE-ACETAMINOPHEN 5-325 MG PO TABS: ORAL_TABLET | ORAL | Status: DC

## 2014-01-22 MED ORDER — PREDNISONE 20 MG PO TABS
ORAL_TABLET | ORAL | Status: DC
Start: 2014-01-22 — End: 2014-06-06
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
## 2014-01-26 ENCOUNTER — Ambulatory Visit (INDEPENDENT_AMBULATORY_CARE_PROVIDER_SITE_OTHER): Payer: 59 | Admitting: Family Medicine

## 2014-01-26 VITALS — BP 132/80 | HR 83 | Temp 98.0°F | Resp 16 | Ht 63.0 in | Wt 149.0 lb

## 2014-01-26 DIAGNOSIS — M79609 Pain in unspecified limb: Secondary | ICD-10-CM

## 2014-01-26 DIAGNOSIS — M79604 Pain in right leg: Secondary | ICD-10-CM

## 2014-01-26 MED ORDER — MELOXICAM 7.5 MG PO TABS: ORAL_TABLET | ORAL | Status: DC

## 2014-01-26 MED ORDER — OXYCODONE-ACETAMINOPHEN 5-325 MG PO TABS: ORAL_TABLET | ORAL | Status: DC
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
## 2014-01-26 NOTE — Patient Instructions (Signed)
I will get in touch with Dr. Fredrich BirksStern's office next week for you.  Continue to use the pain medication as needed; try to taper as much as you can to reduce the risk of dependence.  Once you are finished with the prednisone you can start using mobic as well for pain.  Let me know if you have any significant change in your symptoms or other concerns.   Talk to your HR person at work regarding FMLA and short term disability.

## 2014-02-03 ENCOUNTER — Encounter: Payer: Self-pay | Admitting: Family Medicine

## 2014-02-11 DIAGNOSIS — Z0271 Encounter for disability determination: Secondary | ICD-10-CM

## 2014-03-11 ENCOUNTER — Ambulatory Visit: Payer: 59

## 2014-03-11 ENCOUNTER — Ambulatory Visit (INDEPENDENT_AMBULATORY_CARE_PROVIDER_SITE_OTHER): Payer: 59 | Admitting: *Deleted

## 2014-03-11 VITALS — BP 120/76 | HR 76 | Ht 63.25 in | Wt 146.0 lb

## 2014-03-11 DIAGNOSIS — Z3040 Encounter for surveillance of contraceptives, unspecified: Secondary | ICD-10-CM

## 2014-03-11 MED ORDER — MEDROXYPROGESTERONE ACETATE 150 MG/ML IM SUSP
150.0000 mg | Freq: Once | INTRAMUSCULAR | Status: AC
  Administered 2014-03-11: 150 mg via INTRAMUSCULAR

## 2014-03-11 NOTE — Patient Instructions (Signed)
Please return between 05/27/14 and 06/10/14 for next Depo Provera injection.

## 2014-03-11 NOTE — Progress Notes (Signed)
Patient ID: Kathleen Walters, female   DOB: 1986-01-31, 28 y.o.   MRN: [id number] Pt arrived for Depo Provera injection.  Pt tolerated injection well in right gluteal. Last AEX - 07/10/13 Last Depo Provera Given - 12/19/13 Pt is within due dates. Pt should return between 05/27/14 and 06/10/14 for next injection.

## 2014-06-06 ENCOUNTER — Ambulatory Visit: Payer: 59

## 2014-06-06 ENCOUNTER — Ambulatory Visit (INDEPENDENT_AMBULATORY_CARE_PROVIDER_SITE_OTHER): Payer: 59 | Admitting: *Deleted

## 2014-06-06 VITALS — BP 100/62 | HR 76 | Ht 63.25 in | Wt 147.0 lb

## 2014-06-06 DIAGNOSIS — Z3040 Encounter for surveillance of contraceptives, unspecified: Secondary | ICD-10-CM

## 2014-06-06 MED ORDER — MEDROXYPROGESTERONE ACETATE 150 MG/ML IM SUSP
150.0000 mg | Freq: Once | INTRAMUSCULAR | Status: AC
  Administered 2014-06-06: 150 mg via INTRAMUSCULAR

## 2014-06-06 NOTE — Progress Notes (Signed)
Patient ID: Kathleen Walters, female   DOB: 26-Apr-1986, 28 y.o.   MRN: [id number] Pt arrived for Depo Provera injection.  Pt tolerated injection well in left gluteal. Last AEX - 07/10/13 Last Depo Provera Given - 03/11/14 Pt is within due dates. Pt should return between 08/22/14 and 09/05/14.  Pt was advised she will need AEX prior to next Depo Provera injection.  She will schedule.

## 2014-06-06 NOTE — Patient Instructions (Signed)
Please return between 08/22/14 and 09/05/14 for next Depo Provera injection.  Your Annual Exam is due after 07/10/14.  Please make sure you schedule this appointment prior to your next Depo Provera injection.  We will be unable to give you your next injection if you have not had your Annual Exam.

## 2014-08-14 ENCOUNTER — Ambulatory Visit (INDEPENDENT_AMBULATORY_CARE_PROVIDER_SITE_OTHER): Payer: 59 | Admitting: Certified Nurse Midwife

## 2014-08-14 ENCOUNTER — Ambulatory Visit: Payer: 59 | Admitting: Certified Nurse Midwife

## 2014-08-14 ENCOUNTER — Encounter: Payer: Self-pay | Admitting: Certified Nurse Midwife

## 2014-08-14 VITALS — BP 110/60 | HR 70 | Resp 16 | Ht 63.25 in | Wt 146.0 lb

## 2014-08-14 DIAGNOSIS — Z0000 Encounter for general adult medical examination without abnormal findings: Secondary | ICD-10-CM

## 2014-08-14 DIAGNOSIS — Z01419 Encounter for gynecological examination (general) (routine) without abnormal findings: Secondary | ICD-10-CM

## 2014-08-14 DIAGNOSIS — Z124 Encounter for screening for malignant neoplasm of cervix: Secondary | ICD-10-CM

## 2014-08-14 LAB — HEMOGLOBIN, FINGERSTICK: HEMOGLOBIN, FINGERSTICK: 15 g/dL (ref 12.0–16.0)

## 2014-08-14 NOTE — Patient Instructions (Signed)
General topics  Next pap or exam is  due in 1 year Take a Women's multivitamin Take 1200 mg. of calcium daily - prefer dietary If any concerns in interim to call back  Breast Self-Awareness Practicing breast self-awareness may pick up problems early, prevent significant medical complications, and possibly save your life. By practicing breast self-awareness, you can become familiar with how your breasts look and feel and if your breasts are changing. This allows you to notice changes early. It can also offer you some reassurance that your breast health is good. One way to learn what is normal for your breasts and whether your breasts are changing is to do a breast self-exam. If you find a lump or something that was not present in the past, it is best to contact your caregiver right away. Other findings that should be evaluated by your caregiver include nipple discharge, especially if it is bloody; skin changes or reddening; areas where the skin seems to be pulled in (retracted); or new lumps and bumps. Breast pain is seldom associated with cancer (malignancy), but should also be evaluated by a caregiver. BREAST SELF-EXAM The best time to examine your breasts is 5 7 days after your menstrual period is over.  ExitCare Patient Information 2013 [place].   Exercise to Stay Healthy Exercise helps you become and stay healthy. EXERCISE IDEAS AND TIPS Choose exercises that:  You enjoy.  Fit into your day. You do not need to exercise really hard to be healthy. You can do exercises at a slow or medium level and stay healthy. You can:  Stretch before and after working out.  Try yoga, Pilates, or tai chi.  Lift weights.  Walk fast, swim, jog, run, climb stairs, bicycle, dance, or rollerskate.  Take aerobic classes. Exercises that burn about 150 calories:  Running 1  miles in 15 minutes.  Playing volleyball for 45 to 60 minutes.  Washing and waxing a car for 45 to 60  minutes.  Playing touch football for 45 minutes.  Walking 1  miles in 35 minutes.  Pushing a stroller 1  miles in 30 minutes.  Playing basketball for 30 minutes.  Raking leaves for 30 minutes.  Bicycling 5 miles in 30 minutes.  Walking 2 miles in 30 minutes.  Dancing for 30 minutes.  Shoveling snow for 15 minutes.  Swimming laps for 20 minutes.  Walking up stairs for 15 minutes.  Bicycling 4 miles in 15 minutes.  Gardening for 30 to 45 minutes.  Jumping rope for 15 minutes.  Washing windows or floors for 45 to 60 minutes. Document Released: 01/01/2011 Document Revised: 02/21/2012 Document Reviewed: 01/01/2011 [facility] Patient Information 2013 [place].   Other topics ( that may be useful information):    Sexually Transmitted Disease Sexually transmitted disease (STD) refers to any infection that is passed from person to person during sexual activity. This may happen by way of saliva, semen, blood, vaginal mucus, or urine. Common STDs include:  Gonorrhea.  Chlamydia.  Syphilis.  HIV/AIDS.  Genital herpes.  Hepatitis B and C.  Trichomonas.  Human papillomavirus (HPV).  Pubic lice. CAUSES  An STD may be spread by bacteria, virus, or parasite. A person can get an STD by:  Sexual intercourse with an infected person.  Sharing sex toys with an infected person.  Sharing needles with an infected person.  Having intimate contact with the genitals, mouth, or rectal areas of an infected person. SYMPTOMS  Some people may not have any symptoms, but  they can still pass the infection to others. Different STDs have different symptoms. Symptoms include:  Painful or bloody urination.  Pain in the pelvis, abdomen, vagina, anus, throat, or eyes.  Skin rash, itching, irritation, growths, or sores (lesions). These usually occur in the genital or anal area.  Abnormal vaginal discharge.  Penile discharge in men.  Soft, flesh-colored skin growths in the  genital or anal area.  Fever.  Pain or bleeding during sexual intercourse.  Swollen glands in the groin area.  Yellow skin and eyes (jaundice). This is seen with hepatitis. DIAGNOSIS  To make a diagnosis, your caregiver may:  Take a medical history.  Perform a physical exam.  Take a specimen (culture) to be examined.  Examine a sample of discharge under a microscope.  Perform blood test TREATMENT   Chlamydia, gonorrhea, trichomonas, and syphilis can be cured with antibiotic medicine.  Genital herpes, hepatitis, and HIV can be treated, but not cured, with prescribed medicines. The medicines will lessen the symptoms.  Genital warts from HPV can be treated with medicine or by freezing, burning (electrocautery), or surgery. Warts may come back.  HPV is a virus and cannot be cured with medicine or surgery.However, abnormal areas may be followed very closely by your caregiver and may be removed from the cervix, vagina, or vulva through office procedures or surgery. If your diagnosis is confirmed, your recent sexual partners need treatment. This is true even if they are symptom-free or have a negative culture or evaluation. They should not have sex until their caregiver says it is okay. HOME CARE INSTRUCTIONS  All sexual partners should be informed, tested, and treated for all STDs.  Take your antibiotics as directed. Finish them even if you start to feel better.  Only take over-the-counter or prescription medicines for pain, discomfort, or fever as directed by your caregiver.  Rest.  Eat a balanced diet and drink enough fluids to keep your urine clear or pale yellow.  Do not have sex until treatment is completed and you have followed up with your caregiver. STDs should be checked after treatment.  Keep all follow-up appointments, Pap tests, and blood tests as directed by your caregiver.  Only use latex condoms and water-soluble lubricants during sexual activity. Do not use  petroleum jelly or oils.  Avoid alcohol and illegal drugs.  Get vaccinated for HPV and hepatitis. If you have not received these vaccines in the past, talk to your caregiver about whether one or both might be right for you.  Avoid risky sex practices that can break the skin. The only way to avoid getting an STD is to avoid all sexual activity.Latex condoms and dental dams (for oral sex) will help lessen the risk of getting an STD, but will not completely eliminate the risk. SEEK MEDICAL CARE IF:   You have a fever.  You have any new or worsening symptoms. Document Released: 02/19/2003 Document Revised: 02/21/2012 Document Reviewed: 02/26/2011 [facility] Patient Information 2013 [place].    Domestic Abuse You are being battered or abused if someone close to you hits, pushes, or physically hurts you in any way. You also are being abused if you are forced into activities. You are being sexually abused if you are forced to have sexual contact of any kind. You are being emotionally abused if you are made to feel worthless or if you are constantly threatened. It is important to remember that help is available. No one has the right to abuse you. PREVENTION OF FURTHER  ABUSE  Learn the warning signs of danger. This varies with situations but may include: the use of alcohol, threats, isolation from friends and family, or forced sexual contact. Leave if you feel that violence is going to occur.  If you are attacked or beaten, report it to the police so the abuse is documented. You do not have to press charges. The police can protect you while you or the attackers are leaving. Get the officer's name and badge number and a copy of the report.  Find someone you can trust and tell them what is happening to you: your caregiver, a nurse, clergy member, close friend or family member. Feeling ashamed is natural, but remember that you have done nothing wrong. No one deserves abuse. Document Released:  11/26/2000 Document Revised: 02/21/2012 Document Reviewed: 02/04/2011 ExitCare Patient Information 2013 [employer].    How Much is Too Much Alcohol? Drinking too much alcohol can cause injury, accidents, and health problems. These types of problems can include:   Car crashes.  Falls.  Family fighting (domestic violence).  Drowning.  Fights.  Injuries.  Burns.  Damage to certain organs.  Having a baby with birth defects. ONE DRINK CAN BE TOO MUCH WHEN YOU ARE:  Working.  Pregnant or breastfeeding.  Taking medicines. Ask your doctor.  Driving or planning to drive. If you or someone you know has a drinking problem, get help from a doctor.  Document Released: 09/25/2009 Document Revised: 02/21/2012 Document Reviewed: 09/25/2009 ExitCare Patient Information 2013 [employer].   Smoking Hazards Smoking cigarettes is extremely bad for your health. Tobacco smoke has over 200 known poisons in it. There are over 60 chemicals in tobacco smoke that cause cancer. Some of the chemicals found in cigarette smoke include:   Cyanide.  Benzene.  Formaldehyde.  Methanol (wood alcohol).  Acetylene (fuel used in welding torches).  Ammonia. Cigarette smoke also contains the poisonous gases nitrogen oxide and carbon monoxide.  Cigarette smokers have an increased risk of many serious medical problems and Smoking causes approximately:  90% of all lung cancer deaths in men.  80% of all lung cancer deaths in women.  90% of deaths from chronic obstructive lung disease. Compared with nonsmokers, smoking increases the risk of:  Coronary heart disease by 2 to 4 times.  Stroke by 2 to 4 times.  Men developing lung cancer by 23 times.  Women developing lung cancer by 13 times.  Dying from chronic obstructive lung diseases by 12 times.  . Smoking is the most preventable cause of death and disease in our society.  WHY IS SMOKING ADDICTIVE?  Nicotine is the chemical  agent in tobacco that is capable of causing addiction or dependence.  When you smoke and inhale, nicotine is absorbed rapidly into the bloodstream through your lungs. Nicotine absorbed through the lungs is capable of creating a powerful addiction. Both inhaled and non-inhaled nicotine may be addictive.  Addiction studies of cigarettes and spit tobacco show that addiction to nicotine occurs mainly during the teen years, when young people begin using tobacco products. WHAT ARE THE BENEFITS OF QUITTING?  There are many health benefits to quitting smoking.   Likelihood of developing cancer and heart disease decreases. Health improvements are seen almost immediately.  Blood pressure, pulse rate, and breathing patterns start returning to normal soon after quitting. QUITTING SMOKING   American Lung Association - 1-800-LUNGUSA  American Cancer Society - 1-800-ACS-2345 Document Released: 01/06/2005 Document Revised: 02/21/2012 Document Reviewed: 09/10/2009 ExitCare Patient Information 2013 ExitCare,   LLC.   Stress Management Stress is a state of physical or mental tension that often results from changes in your life or normal routine. Some common causes of stress are:  Death of a loved one.  Injuries or severe illnesses.  Getting fired or changing jobs.  Moving into a new home. Other causes may be:  Sexual problems.  Business or financial losses.  Taking on a large debt.  Regular conflict with someone at home or at work.  Constant tiredness from lack of sleep. It is not just bad things that are stressful. It may be stressful to:  Win the lottery.  Get married.  Buy a new car. The amount of stress that can be easily tolerated varies from person to person. Changes generally cause stress, regardless of the types of change. Too much stress can affect your health. It may lead to physical or emotional problems. Too little stress (boredom) may also become stressful. SUGGESTIONS TO  REDUCE STRESS:  Talk things over with your family and friends. It often is helpful to share your concerns and worries. If you feel your problem is serious, you may want to get help from a professional counselor.  Consider your problems one at a time instead of lumping them all together. Trying to take care of everything at once may seem impossible. List all the things you need to do and then start with the most important one. Set a goal to accomplish 2 or 3 things each day. If you expect to do too many in a single day you will naturally fail, causing you to feel even more stressed.  Do not use alcohol or drugs to relieve stress. Although you may feel better for a short time, they do not remove the problems that caused the stress. They can also be habit forming.  Exercise regularly - at least 3 times per week. Physical exercise can help to relieve that "uptight" feeling and will relax you.  The shortest distance between despair and hope is often a good night's sleep.  Go to bed and get up on time allowing yourself time for appointments without being rushed.  Take a short "time-out" period from any stressful situation that occurs during the day. Close your eyes and take some deep breaths. Starting with the muscles in your face, tense them, hold it for a few seconds, then relax. Repeat this with the muscles in your neck, shoulders, hand, stomach, back and legs.  Take good care of yourself. Eat a balanced diet and get plenty of rest.  Schedule time for having fun. Take a break from your daily routine to relax. HOME CARE INSTRUCTIONS   Call if you feel overwhelmed by your problems and feel you can no longer manage them on your own.  Return immediately if you feel like hurting yourself or someone else. Document Released: 05/25/2001 Document Revised: 02/21/2012 Document Reviewed: 01/15/2008 ExitCare Patient Information 2013 ExitCare, LLC.   
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
## 2014-08-14 NOTE — Addendum Note (Signed)
Addended by: Verner Chol on: 08/14/2014 05:04 PM   Modules accepted: Orders

## 2014-08-14 NOTE — Progress Notes (Signed)
Reviewed personally.  M. Suzanne Zanaya Baize, MD.  

## 2014-08-21 LAB — IPS PAP TEST WITH HPV

## 2014-08-22 ENCOUNTER — Other Ambulatory Visit: Payer: 59

## 2014-08-23 ENCOUNTER — Ambulatory Visit: Payer: 59 | Admitting: Certified Nurse Midwife

## 2014-08-23 ENCOUNTER — Ambulatory Visit (INDEPENDENT_AMBULATORY_CARE_PROVIDER_SITE_OTHER): Payer: 59 | Admitting: *Deleted

## 2014-08-23 VITALS — BP 100/60 | HR 68 | Resp 18 | Ht 63.25 in | Wt 145.0 lb

## 2014-08-23 DIAGNOSIS — Z3040 Encounter for surveillance of contraceptives, unspecified: Secondary | ICD-10-CM

## 2014-08-23 MED ORDER — MEDROXYPROGESTERONE ACETATE 150 MG/ML IM SUSP
150.0000 mg | Freq: Once | INTRAMUSCULAR | Status: AC
  Administered 2014-08-23: 150 mg via INTRAMUSCULAR

## 2014-08-23 NOTE — Progress Notes (Signed)
Patient in today for Depo Provera shot. Patient tolerated shot well.  Paper prescription given to pt as proof of shot today since [place] is [place] out of state. Next Depo due 11/27-12/11.  Last AEX 08/14/14. Last Depo 06/06/14. Patient is on time and denies any problems with any shots in the past.

## 2014-11-27 ENCOUNTER — Telehealth: Payer: Self-pay | Admitting: Certified Nurse Midwife

## 2014-11-27 NOTE — Telephone Encounter (Signed)
Do we still have her chart for previous pill use?

## 2014-11-27 NOTE — Telephone Encounter (Signed)
Routing to Verner Choleborah S. Leonard CNM to review.  Depo provera due by 11/22/14.  No insurance coverage. Requests to start ocp.

## 2014-11-27 NOTE — Telephone Encounter (Signed)
Pt states she missed her depo shot this week and recently moved and is unable to get her shot just yet because of insurance. Asks to switch back to pill form until she can make other arrangements.  Please call (will give name of pharmacy then - pt needs to find one)  Best: [phone]  Bf

## 2014-11-28 ENCOUNTER — Other Ambulatory Visit: Payer: Self-pay | Admitting: Certified Nurse Midwife

## 2014-11-28 DIAGNOSIS — Z30011 Encounter for initial prescription of contraceptive pills: Secondary | ICD-10-CM

## 2014-11-28 MED ORDER — NORETHIN ACE-ETH ESTRAD-FE 1-20 MG-MCG PO TABS: 1.0000 | ORAL_TABLET | Freq: Every day | ORAL | Status: DC

## 2014-11-28 MED ORDER — NORETHINDRONE ACET-ETHINYL EST 1-20 MG-MCG PO TABS: 1.0000 | ORAL_TABLET | Freq: Every day | ORAL | Status: DC

## 2014-11-28 NOTE — Telephone Encounter (Signed)
Please let patient know to start her pills on first day of period and needs condom use during first cycle for protection. Rx in for Loestrin 1/20

## 2014-11-28 NOTE — Telephone Encounter (Signed)
Message left to return call to [facility] at [phone].   Will need local pharmacy at return call and pharmacy order.

## 2014-11-28 NOTE — Telephone Encounter (Signed)
I do not have a pharmacy for her. Can you obtain for us so Rx can be sent.

## 2014-11-28 NOTE — Telephone Encounter (Signed)
Patient returned call. [facility]   Patient advised new rx would be sent and instructions from Verner Choleborah S. Leonard CNM given, emphasized need to take pill with first day of cycle and to use back up method now and until one month after start of ocp.. Patient aware of risks, side effects and instructed on use. Advised pack may have only 3 weeks of active pills and to ensure that she allows for one week of no pills to have menses and to restart pills after one week off of pills, advised to set timer or mark calendar so she will remember.   Patient states that when stopping Depo provera shot prior, she did not have a cycle for at least 3 months. Advised patient to wait one month, take pregnancy test and call our office if no cycle after one month.  She is agreeable and verbalized understanding of all instructions.  Routing to provider for final review. Patient agreeable to disposition. Will close encounter

## 2014-11-28 NOTE — Telephone Encounter (Signed)
Paper chart to your desk. Thank you

## 2015-03-25 ENCOUNTER — Telehealth: Payer: Self-pay | Admitting: Certified Nurse Midwife

## 2015-03-25 NOTE — Telephone Encounter (Signed)
Patient is agreeable and will call [place].   Routing to provider as Lorain ChildesFYI as encounter was previously closed.

## 2015-03-25 NOTE — Telephone Encounter (Signed)
Pt would like D.Leonard's nurse to call her regarding birth control and her cycle - pt has not had cycle since stopping depo in December - preg test neg.

## 2015-03-25 NOTE — Telephone Encounter (Signed)
She needs to see Gynecologist

## 2015-03-25 NOTE — Telephone Encounter (Signed)
Spoke with patient. Patient states that since stopping the depo injections in December she has not had a cycle. Patient has taken two UPT which have both been negative. Advised patient it is not uncommon for her not to have a cycle after stopping depo after using for an extended period of time. Advised will need to be seen for evaluation to make sure nothing else is causing her not to have her cycle. Advised may need something to get her cycle restarted and regulated. Patient states that [place] and is unable to come in for an appointment. Advised recommend that she be seen locally for further evaluation. Patient is agreeable. "Does this need to be with a Gynecologist? Or could I see a primary care." Advised can be evaluated by either but recommend she establish care with both in her area. Patient is agreeable and will call to schedule an appointment in her area for evaluation. Will return call if she needs any further assistance.  Routing to provider for final review. Patient agreeable to disposition. Will close encounter

## 2015-05-14 ENCOUNTER — Other Ambulatory Visit: Payer: Self-pay | Admitting: Certified Nurse Midwife

## 2015-05-14 NOTE — Telephone Encounter (Signed)
11/28/14 #1/11 rfs was sent to [facility] in [place]/[address]Denied.

## 2015-10-16 ENCOUNTER — Encounter: Payer: Self-pay | Admitting: Certified Nurse Midwife

## 2015-10-16 ENCOUNTER — Ambulatory Visit (INDEPENDENT_AMBULATORY_CARE_PROVIDER_SITE_OTHER): Payer: PRIVATE HEALTH INSURANCE | Admitting: Certified Nurse Midwife

## 2015-10-16 VITALS — BP 130/80 | HR 66 | Resp 14 | Ht 63.0 in | Wt 131.0 lb

## 2015-10-16 DIAGNOSIS — Z0000 Encounter for general adult medical examination without abnormal findings: Secondary | ICD-10-CM | POA: Diagnosis not present

## 2015-10-16 DIAGNOSIS — Z124 Encounter for screening for malignant neoplasm of cervix: Secondary | ICD-10-CM | POA: Diagnosis not present

## 2015-10-16 DIAGNOSIS — Z01419 Encounter for gynecological examination (general) (routine) without abnormal findings: Secondary | ICD-10-CM | POA: Diagnosis not present

## 2015-10-16 LAB — POCT URINALYSIS DIPSTICK
BILIRUBIN UA: NEGATIVE
Glucose, UA: NEGATIVE
KETONES UA: NEGATIVE
Leukocytes, UA: NEGATIVE
Nitrite, UA: NEGATIVE
PH UA: 6
Protein, UA: NEGATIVE
RBC UA: NEGATIVE
Urobilinogen, UA: NEGATIVE

## 2015-10-16 LAB — HEMOGLOBIN, FINGERSTICK: Hemoglobin, fingerstick: 14.3 g/dL (ref 12.0–16.0)
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
## 2015-10-16 NOTE — Patient Instructions (Signed)

## 2015-10-20 LAB — IPS PAP TEST WITH REFLEX TO HPV

## 2015-10-20 NOTE — Progress Notes (Signed)
Encounter reviewed Jill Jertson, MD   

## 2016-02-09 ENCOUNTER — Telehealth: Payer: Self-pay | Admitting: Certified Nurse Midwife

## 2016-02-09 NOTE — Telephone Encounter (Signed)
Patient called and left a message on the machine during lunch. She said, "I'd like to start back on birth control. Debbi told me to just call if I wanted to start again."

## 2016-02-09 NOTE — Telephone Encounter (Signed)
Left message to call Kaitlyn at [phone]. 

## 2016-02-10 MED ORDER — NORETHINDRONE 0.35 MG PO TABS: 1.0000 | ORAL_TABLET | Freq: Every day | ORAL | Status: DC

## 2016-02-10 NOTE — Telephone Encounter (Signed)
Spoke with patient. Advised of message as seen below from [employer] CNM. She is agreeable and verbalizes understanding. Rx for Camila 0.35 mg #1 8RF sent to patient's pharmacy of choice.  Routing to provider for final review. Patient agreeable to disposition. Will close encounter.

## 2016-02-10 NOTE — Telephone Encounter (Signed)
Spoke with patient. Patient states that she would like to start on a POP at this time. States when she was taking Junel she began to have darkening of her skin on her face. Once she stopped taking the Junel this cleared. Reports she did well with Depo Provera, but she has moved and is unable to come in to the office every 3 months. Advised I will speak with Leota Sauers CNM about starting on POP and return call with further recommendations. Pharmacy on file has been updated.

## 2016-02-10 NOTE — Telephone Encounter (Signed)
OK to do Camilla again until aex

## 2016-06-02 ENCOUNTER — Telehealth: Payer: Self-pay | Admitting: *Deleted

## 2016-06-02 NOTE — Telephone Encounter (Signed)
Patient called wanting to speak to a nurse concerning her menstrual cycle and her birth control.

## 2016-06-02 NOTE — Telephone Encounter (Signed)
Returned call to patient: Last annual exam 10-16-15. On Camilla for contraception.  Reports BTB like a cycle  Every 2 weeks since May 22. Bleeding 5-22, 6-4 and started again today 6-21.  Denies missed or late pills or change in generic form of medication. Newly sexually active again starting in April with new partner. Using condoms and Louinamilla. Denies pain. Has had some breast tenderness associated with normal cycle. Has not done UPT.  Advised recommend office visit. Patient is in [place] so initially declined office visit until next week. Advised needs UPT to rule out pregnancy and recommend OV at earliest convenience. Office visit then scheduled for Friday ay 1000 with Lovett Soxebbi Leonard, CNM. Patient will do UPT tonight and call back if positive.  Routing to provider for final review. Patient agreeable to disposition. Will close encounter.

## 2016-06-04 ENCOUNTER — Encounter: Payer: Self-pay | Admitting: Certified Nurse Midwife

## 2016-06-04 ENCOUNTER — Ambulatory Visit (INDEPENDENT_AMBULATORY_CARE_PROVIDER_SITE_OTHER): Payer: No Typology Code available for payment source | Admitting: Certified Nurse Midwife

## 2016-06-04 VITALS — BP 108/68 | HR 68 | Resp 16 | Ht 63.0 in | Wt 147.0 lb

## 2016-06-04 DIAGNOSIS — N926 Irregular menstruation, unspecified: Secondary | ICD-10-CM

## 2016-06-04 DIAGNOSIS — Z3041 Encounter for surveillance of contraceptive pills: Secondary | ICD-10-CM | POA: Diagnosis not present

## 2016-06-04 LAB — TSH: TSH: 0.66 m[IU]/L

## 2016-06-04 LAB — POCT URINE PREGNANCY: Preg Test, Ur: NEGATIVE

## 2016-06-04 NOTE — Progress Notes (Signed)
Reviewed personally.  M. Suzanne Marlaya Turck, MD.  

## 2016-06-04 NOTE — Patient Instructions (Signed)

## 2016-06-04 NOTE — Progress Notes (Signed)
30 y.o. Single Caucasian female G0P0000 with LMP June  21,17 here complaining of ?DUB bleeding with contraceptive use of POP.   Using contraception method as prescribed.  Denies inconsistent use.  No new medications. Describes break through bleeding occurring mid cycle with 4 day duration moderate, "like 2 periods a month for the past 1 1/2 months. Patient has gained 14 pounds in the last few months.  Patient is experiencing very slight cramping with menses. Patient uses OTC medication or patient comfort measures with good relief. Patient aware of bleeding profile with POP and had previous use of Depo Provera. Used Junel 1/20 in past without issues. "Feels more hormonal, with acne increase in the last month only. But has moved back with parents and caring for grandmother, "better place for me now". No other health issues today. But would like to have STD screening, no partner change.   O: Healthy WN WD female   Alert oriented x3 Skin:warm and dry Thyroid:normal to inspection and palpation Pelvic exam: external genitalia, no lesions normal female appearance BUS: negative Bladder/uretheral meatus non tender Vagina: normal appearance, non tender with scant dark blood present, affirm taken Cervix: normal appearance, non tender, no CMT, scant blood from cervical os noted Uterus: normal, non tender, no masses Adnexa: normal, non tender, no masses or fullness   A:Normal Pelvic exam 2-Normal bleeding profile with contraceptive use of Camilla Hgb-14.1  POCT UPT negative STD screening   P:Reviewed findings of normal pelvic exam and small amount of bleeding noted. Discussed Thyroid and weight effect on menses and flow. Questions addressed. Also discussed bleeding profile with POP as with Depo Provera she had used before. Patient aware. Discussed Motrin use 800 mg with food every 8 hours for 24 hours to see if resolves. Patient will keep menses record and see this just resolves to monthly cycle over next  month. Warning signs of bleeding reviewed and need to advise. Patient desires no contraception at present. . Labs: HIV,RPR, Affirm, Gc,Chlalmydia Continue Camilla use as prescribed with consistent time use daily.  Rv prn

## 2016-06-05 ENCOUNTER — Other Ambulatory Visit: Payer: Self-pay | Admitting: Certified Nurse Midwife

## 2016-06-05 DIAGNOSIS — B373 Candidiasis of vulva and vagina: Secondary | ICD-10-CM

## 2016-06-05 DIAGNOSIS — N760 Acute vaginitis: Secondary | ICD-10-CM

## 2016-06-05 DIAGNOSIS — B3731 Acute candidiasis of vulva and vagina: Secondary | ICD-10-CM

## 2016-06-05 DIAGNOSIS — B9689 Other specified bacterial agents as the cause of diseases classified elsewhere: Secondary | ICD-10-CM

## 2016-06-05 LAB — HIV ANTIBODY (ROUTINE TESTING W REFLEX): HIV 1&2 Ab, 4th Generation: NONREACTIVE

## 2016-06-05 LAB — WET PREP BY MOLECULAR PROBE
Candida species: POSITIVE — AB
GARDNERELLA VAGINALIS: POSITIVE — AB
TRICHOMONAS VAG: NEGATIVE

## 2016-06-05 LAB — RPR

## 2016-06-05 MED ORDER — METRONIDAZOLE 0.75 % VA GEL: 1.0000 | Freq: Two times a day (BID) | VAGINAL | Status: DC

## 2016-06-05 MED ORDER — FLUCONAZOLE 150 MG PO TABS: 150.0000 mg | ORAL_TABLET | Freq: Once | ORAL | Status: DC

## 2016-06-08 LAB — IPS N GONORRHOEA AND CHLAMYDIA BY PCR

## 2016-06-10 ENCOUNTER — Telehealth: Payer: Self-pay | Admitting: Certified Nurse Midwife

## 2016-06-10 MED ORDER — METRONIDAZOLE 500 MG PO TABS: 500.0000 mg | ORAL_TABLET | Freq: Two times a day (BID) | ORAL | Status: DC

## 2016-06-10 NOTE — Telephone Encounter (Signed)
Patient is requesting Flagyl for alternative to Metrogel for treatment of BV. Routing to [employer]orah Leonard CNM for review and advise.

## 2016-06-10 NOTE — Telephone Encounter (Signed)
[facility] calling regarding patient's metrogel prescription. Patient prefers the tabs. [phone].

## 2016-06-10 NOTE — Telephone Encounter (Signed)
OK to use 

## 2016-06-10 NOTE — Telephone Encounter (Signed)
Rx for Flagyl 500 mg bid #14 0RF sent to [facility]. Left detailed message for patient at number provided [phone], okay per ROI. Advised rx for Flagyl has been sent to her pharmacy. ETOH precautions given. Advised to return call with any further questions or concerns.  Routing to provider for final review. Patient agreeable to disposition. Will close encounter.

## 2016-06-16 LAB — HEMOGLOBIN, FINGERSTICK: Hemoglobin, fingerstick: 14.1 g/dL (ref 12.0–16.0)

## 2016-09-20 ENCOUNTER — Other Ambulatory Visit: Payer: Self-pay | Admitting: Certified Nurse Midwife

## 2016-09-20 NOTE — Telephone Encounter (Signed)
Medication refill request: MICRONOR 0.35mg  Last AEX:  10/16/15 DL Next AEX: none scheduled Last MMG (if hormonal medication request): n/a Refill authorized: 02/10/16 #1package w/8 refills; today please advise

## 2016-10-13 ENCOUNTER — Telehealth: Payer: Self-pay | Admitting: Certified Nurse Midwife

## 2016-10-13 NOTE — Telephone Encounter (Signed)
Deborah Leonard, CNM -ok to close encounter?  

## 2016-10-13 NOTE — Telephone Encounter (Signed)
Agree she needs aex due history of abnormal pap and DUB

## 2016-10-13 NOTE — Telephone Encounter (Signed)
Spoke with patient. Patient states she is on OCP Mylan 0.35mg . Patient states she has breakthrough bleeding with a cycle every 2-3 weeks. Patient states 10/15 cycle for 5 days with normal flow changing tampon 3-4 times per day, then again 10/27. Patient states she is taking OCP on regular basis, no misses doses. Patient request change of OCP. Patient denies pain, fatigue, dizziness.  Recommended OV, patient declined. Patient states she may have to change provider due to insurance coverage. Patient states last OV 06/04/16  she pain over $300 for visit. Advised patient last AEX 10/16/15. Offered AEX appt 10/15/16 at 3:15pm, patient declined due to work schedule. Patient states she needs afternoon appt and more notice for work, patient scheduled for AEX 10/29/16 at 3:15pm. Patient states she is going to check on insurance coverage for OV and will return call if she can schedule earlier appt. Advised patient will review with Leota Sauerseborah Leonard, CNM  and return call if any additional recommendations. Patient is agreeable.

## 2016-10-13 NOTE — Telephone Encounter (Signed)
Patient having break through bleeding on her birth control.  Wants to know if she can change her birth control.  Ok to leave detailed message on her cell if she does not answer.

## 2016-10-13 NOTE — Telephone Encounter (Signed)
yes

## 2016-10-15 ENCOUNTER — Ambulatory Visit (INDEPENDENT_AMBULATORY_CARE_PROVIDER_SITE_OTHER): Payer: No Typology Code available for payment source | Admitting: Certified Nurse Midwife

## 2016-10-15 ENCOUNTER — Encounter: Payer: Self-pay | Admitting: Certified Nurse Midwife

## 2016-10-15 VITALS — BP 102/60 | HR 68 | Resp 16 | Ht 63.5 in | Wt 156.0 lb

## 2016-10-15 DIAGNOSIS — Z124 Encounter for screening for malignant neoplasm of cervix: Secondary | ICD-10-CM | POA: Diagnosis not present

## 2016-10-15 DIAGNOSIS — Z01419 Encounter for gynecological examination (general) (routine) without abnormal findings: Secondary | ICD-10-CM | POA: Diagnosis not present

## 2016-10-15 DIAGNOSIS — Z30013 Encounter for initial prescription of injectable contraceptive: Secondary | ICD-10-CM

## 2016-10-15 DIAGNOSIS — Z0000 Encounter for general adult medical examination without abnormal findings: Secondary | ICD-10-CM

## 2016-10-15 LAB — POCT URINALYSIS DIPSTICK
BILIRUBIN UA: NEGATIVE
GLUCOSE UA: NEGATIVE
Ketones, UA: NEGATIVE
LEUKOCYTES UA: NEGATIVE
NITRITE UA: NEGATIVE
Protein, UA: NEGATIVE
RBC UA: NEGATIVE
UROBILINOGEN UA: NEGATIVE
pH, UA: 5

## 2016-10-15 LAB — TSH: TSH: 0.57 m[IU]/L

## 2016-10-15 MED ORDER — MEDROXYPROGESTERONE ACETATE 150 MG/ML IM SUSP: 150.0000 mg | INTRAMUSCULAR | 3 refills | Status: DC
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
## 2016-10-15 NOTE — Patient Instructions (Signed)

## 2016-10-16 ENCOUNTER — Other Ambulatory Visit: Payer: Self-pay | Admitting: Certified Nurse Midwife

## 2016-10-16 DIAGNOSIS — N760 Acute vaginitis: Principal | ICD-10-CM

## 2016-10-16 DIAGNOSIS — B9689 Other specified bacterial agents as the cause of diseases classified elsewhere: Secondary | ICD-10-CM

## 2016-10-16 LAB — WET PREP BY MOLECULAR PROBE
CANDIDA SPECIES: NEGATIVE
Gardnerella vaginalis: POSITIVE — AB
Trichomonas vaginosis: NEGATIVE

## 2016-10-16 LAB — COMPREHENSIVE METABOLIC PANEL
ALBUMIN: 4.6 g/dL (ref 3.6–5.1)
ALK PHOS: 56 U/L (ref 33–115)
ALT: 19 U/L (ref 6–29)
AST: 16 U/L (ref 10–30)
BILIRUBIN TOTAL: 0.4 mg/dL (ref 0.2–1.2)
BUN: 12 mg/dL (ref 7–25)
CO2: 24 mmol/L (ref 20–31)
CREATININE: 0.8 mg/dL (ref 0.50–1.10)
Calcium: 9.4 mg/dL (ref 8.6–10.2)
Chloride: 102 mmol/L (ref 98–110)
Glucose, Bld: 82 mg/dL (ref 65–99)
Potassium: 3.8 mmol/L (ref 3.5–5.3)
SODIUM: 141 mmol/L (ref 135–146)
Total Protein: 7.1 g/dL (ref 6.1–8.1)

## 2016-10-16 LAB — LIPID PANEL
Cholesterol: 185 mg/dL (ref 125–200)
HDL: 100 mg/dL (ref >=46)
LDL CALC: 75 mg/dL (ref <130)
TRIGLYCERIDES: 50 mg/dL (ref <150)
Total CHOL/HDL Ratio: 1.9 Ratio (ref <=5.0)
VLDL: 10 mg/dL (ref <30)

## 2016-10-16 MED ORDER — METRONIDAZOLE 0.75 % VA GEL: 1.0000 | Freq: Two times a day (BID) | VAGINAL | 0 refills | Status: DC

## 2016-10-18 LAB — HEMOGLOBIN, FINGERSTICK: Hemoglobin, fingerstick: 13.6 g/dL (ref 12.0–16.0)

## 2016-10-18 NOTE — Progress Notes (Signed)
Encounter reviewed Jill Jertson, MD   

## 2016-10-19 ENCOUNTER — Other Ambulatory Visit: Payer: Self-pay | Admitting: Certified Nurse Midwife

## 2016-10-19 MED ORDER — METRONIDAZOLE 500 MG PO TABS: 500.0000 mg | ORAL_TABLET | Freq: Two times a day (BID) | ORAL | 0 refills | Status: DC

## 2016-10-20 LAB — IPS PAP TEST WITH REFLEX TO HPV

## 2016-10-21 ENCOUNTER — Other Ambulatory Visit: Payer: Self-pay | Admitting: Certified Nurse Midwife

## 2016-10-21 ENCOUNTER — Telehealth: Payer: Self-pay

## 2016-10-21 DIAGNOSIS — R87610 Atypical squamous cells of undetermined significance on cytologic smear of cervix (ASC-US): Secondary | ICD-10-CM

## 2016-10-21 NOTE — Telephone Encounter (Signed)
Spoke with patient. Advised of results as seen below from [employer]orah Leonard CNM. Patient is agreeable and verbalizes understanding. Patient is currently taking POP for contraception. Is on her last week of her current pack. Due to start a new pack on 10/24/2016. Patient is off work on 10/26/2016 and requests to have colposcopy performed on that day. Appointment scheduled for 10/26/2016 at 10 am with Leota Sauerseborah Leonard CNM.   Instructions given. Motrin 800 mg po x , one hour before appointment with food. Make sure to eat a meal before appointment and drink plenty of fluids. Patient verbalized understanding and will call to reschedule if will be on menses or has any concerns regarding pregnancy.  Patient agreeable and verbalized understanding of all instructions.   Routing to provider for final review. Patient agreeable to disposition. Will close encounter.

## 2016-10-21 NOTE — Telephone Encounter (Signed)
-----   Message from Verner Choleborah S Leonard, CNM sent at 10/21/2016  7:44 AM EST ----- Notify patient that pap smear is abnormal with ASCUS HPVHR detected. Will need colposcopy evaluation Please schedule order placed

## 2016-10-25 ENCOUNTER — Telehealth: Payer: Self-pay | Admitting: Certified Nurse Midwife

## 2016-10-25 NOTE — Telephone Encounter (Signed)
Called patient to review benefits for procedure. Left voicemail to call back and review. Patient set to arrive for appointment on 10/26/16 at 945am. Per recent DPR, left details of patients benefits on her voicemail. Advised office hours to return call and she may ask to speak with [place]y or Suzy.

## 2016-10-25 NOTE — Telephone Encounter (Signed)
Spoke with patient. Aware and agreeable to benefits and plan discussed. Noted in guarantor notes. No further questions. Patient aware and agreeable to arrival date and time. Ok to close.

## 2016-10-26 ENCOUNTER — Encounter: Payer: Self-pay | Admitting: Certified Nurse Midwife

## 2016-10-26 ENCOUNTER — Ambulatory Visit (INDEPENDENT_AMBULATORY_CARE_PROVIDER_SITE_OTHER): Payer: No Typology Code available for payment source | Admitting: Certified Nurse Midwife

## 2016-10-26 VITALS — BP 114/68 | HR 74 | Resp 16 | Ht 63.5 in | Wt 157.0 lb

## 2016-10-26 DIAGNOSIS — Z9889 Other specified postprocedural states: Secondary | ICD-10-CM

## 2016-10-26 DIAGNOSIS — Z9289 Personal history of other medical treatment: Secondary | ICD-10-CM

## 2016-10-26 DIAGNOSIS — R87610 Atypical squamous cells of undetermined significance on cytologic smear of cervix (ASC-US): Secondary | ICD-10-CM

## 2016-10-26 NOTE — Progress Notes (Signed)
Previous CIN 2 & 3 10-15-16 ASCUS HPV HR + Pt took 1000mg  ibuprofen at home at 9am.

## 2016-10-26 NOTE — Patient Instructions (Signed)
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
## 2016-10-28 ENCOUNTER — Telehealth: Payer: Self-pay

## 2016-10-28 LAB — IPS OTHER TISSUE BIOPSY

## 2016-10-28 NOTE — Telephone Encounter (Signed)
Spoke with patient. Advised of message and results as seen below from Leota Sauerseborah Leonard CNM. Patient is agreeable and verbalizes understanding. Patient declines to schedule 6 month pap at this time as she does not have her calendar. Will return call to schedule appointment. 6 month recall placed.  Routing to provider for final review. Patient agreeable to disposition. Will close encounter.

## 2016-10-28 NOTE — Telephone Encounter (Signed)
-----   Message from Verner Choleborah S Leonard, CNM sent at 10/28/2016 10:57 AM EST ----- Notify patient that colposcopy biopsy was negative for dysplasia or malignancy ECC show benign endocervical epithelial cells, negative for atypia.  Repeat pap smear in 6 months due to negative findings and no association with pap.  Due to previous history of CIN 3 with LEEP feel this a good idea. 06 pap recall

## 2016-10-29 ENCOUNTER — Ambulatory Visit: Payer: Self-pay | Admitting: Certified Nurse Midwife

## 2016-11-02 NOTE — Progress Notes (Signed)
Encounter reviewed Jill Jertson, MD   

## 2016-11-29 ENCOUNTER — Telehealth: Payer: Self-pay | Admitting: Certified Nurse Midwife

## 2016-11-29 NOTE — Telephone Encounter (Signed)
Patient called requesting to speak with the nurse about the window for scheduling her next depo shot. She said, "I am spotting slightly now and thinking about just scheduling for Thursday, 12/01/16. I just want to be sure because I may be done or almost done by then."

## 2016-11-29 NOTE — Telephone Encounter (Signed)
Spoke with patient. Patient states that she started spotting today and would like to schedule her first Depo injection. Appointment scheduled for 12/02/2016 at 10 am. Patient is agreeable to date and time.  Routing to provider for final review. Patient agreeable to disposition. Will close encounter.

## 2016-12-02 ENCOUNTER — Ambulatory Visit (INDEPENDENT_AMBULATORY_CARE_PROVIDER_SITE_OTHER): Payer: No Typology Code available for payment source | Admitting: *Deleted

## 2016-12-02 ENCOUNTER — Ambulatory Visit: Payer: No Typology Code available for payment source

## 2016-12-02 VITALS — BP 120/64 | HR 92 | Resp 14 | Ht 63.5 in | Wt 158.0 lb

## 2016-12-02 DIAGNOSIS — Z30013 Encounter for initial prescription of injectable contraceptive: Secondary | ICD-10-CM

## 2016-12-02 MED ORDER — MEDROXYPROGESTERONE ACETATE 150 MG/ML IM SUSP
150.0000 mg | Freq: Once | INTRAMUSCULAR | Status: AC
  Administered 2016-12-02: 150 mg via INTRAMUSCULAR

## 2016-12-02 NOTE — Progress Notes (Signed)
Patient is here for Depo Provera Injection Patient is within Depo Provera Calender Limits 1st depo Next Depo Due between: 3/8-3/22 Last AEX: 10/15/16 DL AEX Scheduled: none scheduled  Patient is aware when next depo is due  Pt tolerated Injection well.  Routed to provider for review, encounter closed.

## 2017-01-19 ENCOUNTER — Telehealth: Payer: Self-pay | Admitting: *Deleted

## 2017-01-19 ENCOUNTER — Encounter: Payer: Self-pay | Admitting: Certified Nurse Midwife

## 2017-01-19 NOTE — Telephone Encounter (Signed)
Patient was seen in 6/17 and 11/17 with BV. Have her do baking soda sitz bath to see if this will take care of the symptoms. The type condom she is using could be cause. Try lambskin or latex free to see if this causes less problems. Rinsing after sexual activity each time also has been found to reduce occurrence.

## 2017-01-19 NOTE — Telephone Encounter (Signed)
Ok for Metrogel this one time. If not resolving needs OV with Depo

## 2017-01-19 NOTE — Telephone Encounter (Signed)
Left message to call Leotha Voeltz at [phone].  

## 2017-01-19 NOTE — Telephone Encounter (Signed)
Kathleen Walters, CNM -see MyChart message below and advise?  From Kathleen Walters To Kathleen Walters, CNM Sent 01/19/2017 8:57 AM  Kathleen Walters,  I am starting to get concerned about me having constant bacterial infections, ever since having a new partner for the last 6 months, I've had them a lot and think I currently have another one. Is there something I should be doing about my partners health? We use condoms every time. Are there things that can be changed to help prevent this from reoccurring. Is there any way I can get a refill on the metronidazole tablets without being seen. I have a high deductible with no copay and I'm still paying off the colposcopy procedure from last year. My 65mo repeat pap is in May but I really do not want to wait that long. Should I try the cream version instead of the tablet? I only use tablet due to cost, but if the cream is more effective than ill pay for it. Any advice would help. Thank you for everything

## 2017-01-19 NOTE — Telephone Encounter (Signed)
Spoke with patient, advised as seen below per Kathleen Walters, CNM. Patient states she is currently on depo provera for contraception, LMP 01/02/17. Patient states she has been experiencing intermittent brown vaginal discharge. Patient states she experienced bright red spotting with wiping after intercourse 01/18/17. Patient states she has been with same partner since last OV and not using any lubricant with intercourse. Patient states she experiences no pain during intercourse. Patient denies vaginal odor, pain, burning or itching. Patient states she had the same brown vaginal discharge last 2 times she was positive for BV and states she knows the medication will clear it. Advised patient OV would be needed for evaluation to insure appropriate treatment with medication, patient declined at this time. Patient states she is returning in March for depo, maybe I can schedule OV instead on nurse visit for depo. Patient states she lives in [place] and would need to do everything at once. Advised patient can look at rescheduling. Advised patient would review with Kathleen Walters, CNM and return call with any additional recommendations, patient is agreeable.   Kathleen Walters, CNM -any additional recommendations?

## 2017-01-19 NOTE — Telephone Encounter (Signed)
See telephone encounter created 01/19/17 to review with provider.  

## 2017-01-19 NOTE — Telephone Encounter (Signed)
Reviewed with patient via telephone, see encounter dated 01/19/17.

## 2017-01-20 ENCOUNTER — Telehealth: Payer: Self-pay | Admitting: Certified Nurse Midwife

## 2017-01-20 ENCOUNTER — Other Ambulatory Visit: Payer: Self-pay

## 2017-01-20 MED ORDER — METRONIDAZOLE 0.75 % VA GEL: 1.0000 | Freq: Every day | VAGINAL | 0 refills | Status: DC

## 2017-01-20 NOTE — Telephone Encounter (Signed)
Spoke with patient. Advised of message as seen below from PepsiCoDeborah Leonard CNM. Patient verbalizes understanding. Rx for Metrogel place 1 applicator nightly for 5 nights sent to pharmacy on file. Aware she will need OV if symptoms persist.  Routing to provider for final review. Patient agreeable to disposition. Will close encounter.

## 2017-01-20 NOTE — Telephone Encounter (Signed)
Patient is asking that the prescription that was called to her mail order pharmacy today be sent to a local pharmacy instead. [facility] , [address] [phone].

## 2017-01-20 NOTE — Telephone Encounter (Signed)
Spoke to patient and [facility] was [facility].

## 2017-01-26 ENCOUNTER — Telehealth: Payer: Self-pay | Admitting: Certified Nurse Midwife

## 2017-01-26 NOTE — Telephone Encounter (Signed)
Agree with plan 

## 2017-01-26 NOTE — Telephone Encounter (Signed)
Spoke with patient. Patient states she completed metrogel 2/12 and has noticed an increase in dark brown/maroon vaginal discharge over last couple of days, is this normal? Patient denies vaginal itching, odor or irritation. Advised patient increased vaginal discharge is normal with use of metrogel. Advised patient to continue to monitor, can take vagina 1week to re-balance after use of metrogel. Advised patient if symptoms continue and/or new symptoms develop, return call for OV. Patient verbalizes understanding and is agreeable.   Leota Sauerseborah Leonard, CNM -any additional recommendations?

## 2017-01-26 NOTE — Telephone Encounter (Signed)
Patient was given  Metrogel and has started having dark brown discharge. Wants to speak with someone about it.

## 2017-02-18 ENCOUNTER — Encounter: Payer: Self-pay | Admitting: Certified Nurse Midwife

## 2017-02-18 ENCOUNTER — Ambulatory Visit: Payer: No Typology Code available for payment source

## 2017-02-18 ENCOUNTER — Ambulatory Visit (INDEPENDENT_AMBULATORY_CARE_PROVIDER_SITE_OTHER): Payer: No Typology Code available for payment source | Admitting: Certified Nurse Midwife

## 2017-02-18 VITALS — BP 110/70 | HR 64 | Resp 16 | Ht 63.5 in | Wt 159.0 lb

## 2017-02-18 DIAGNOSIS — Z3042 Encounter for surveillance of injectable contraceptive: Secondary | ICD-10-CM | POA: Diagnosis not present

## 2017-02-18 DIAGNOSIS — N898 Other specified noninflammatory disorders of vagina: Secondary | ICD-10-CM | POA: Diagnosis not present

## 2017-02-18 MED ORDER — MEDROXYPROGESTERONE ACETATE 150 MG/ML IM SUSP
150.0000 mg | Freq: Once | INTRAMUSCULAR | Status: AC
  Administered 2017-02-18: 150 mg via INTRAMUSCULAR

## 2017-02-18 NOTE — Patient Instructions (Signed)

## 2017-02-18 NOTE — Progress Notes (Signed)
31 y.o. Single Caucasian female G0P0000 here with complaint of vaginal symptoms of increase discharge can be dark brown with occasional red. Marland Kitchen. Describes discharge as mucosy  With brown pieces. Noted after she had been on Depo Provera for 2 months. Onset of symptoms ? 3 weeks  ago. Denies new personal products .No  STD concerns. Urinary symptoms none . Contraception is Depo Provera due for injection today.  ROS Pertinent to HPI  O:Healthy female WDWN Affect: normal, orientation x 3  Exam:Skin: warm and dry Abdomen:soft,non tender  Inguinal Lymph nodes: no enlargement or tenderness Pelvic exam: External genital: normal female, no lesions, scaling or exudate BUS: negative Vagina: scant slightly beige tint, non odorous discharge noted.  Affirm taken Cervix: normal, non tender, no CMT Uterus: normal, non tender Adnexa:normal, non tender, no masses or fullness noted    A:Normal pelvic exam Normal surveillance of Depo Provera bleeding profile R/O vaginal infection   P:Discussed findings of normal pelvic exam and no indication of vaginal infection with discharge appearance. Will treat if affirm indicates. Discussed bleeding profile expectations with Depo Provera and with first injection. Questions addressed at length. This may improve with next 3 months, patient has used before and just was concerned something was wrong. "feel better now". Will advise if concerns or symptoms change. Requests Depo Provera today. Lab: affirm  Rv prn and Depo schedule

## 2017-02-18 NOTE — Progress Notes (Signed)
Patient is here for Depo Provera Injection Patient is within Depo Provera Calender Limits Yes,  3/8 - 3/22 Next Depo Due between: 5/25 - 6/9 Last AEX: 10/15/16 DL AEX Scheduled: 1/6/105/4/18 DL  Patient is aware when next depo is due  Pt tolerated Injection well. Injection given in LUOQ.   Routed to provider for review, encounter closed.

## 2017-02-19 LAB — WET PREP BY MOLECULAR PROBE
Candida species: NOT DETECTED
Gardnerella vaginalis: NOT DETECTED
Trichomonas vaginosis: NOT DETECTED

## 2017-02-27 NOTE — Progress Notes (Signed)
Encounter reviewed [name], MD   

## 2017-04-11 ENCOUNTER — Telehealth: Payer: Self-pay | Admitting: Certified Nurse Midwife

## 2017-04-11 NOTE — Telephone Encounter (Signed)
Patient canceled her 6 mo pap. She states she will come in May 2018 to have this done along with her next Depo shot.

## 2017-04-14 NOTE — Telephone Encounter (Signed)
Spoke with patient to assist with scheduling for depo and 6 month pap. Patient states she has not had a chance to look at her schedule, is aware depo due between 5/25-6/9. Patient states she will return call back to office for scheduling.

## 2017-04-14 NOTE — Telephone Encounter (Signed)
I do not see where she has scheduled. Please follow up.

## 2017-04-15 ENCOUNTER — Ambulatory Visit: Payer: No Typology Code available for payment source | Admitting: Certified Nurse Midwife

## 2017-04-19 NOTE — Telephone Encounter (Signed)
She has an appointment scheduled.

## 2017-04-19 NOTE — Telephone Encounter (Signed)
Appointment is on 05/06/2017 with Leota Sauerseborah Leonard CNM. Will close encounter.

## 2017-05-06 ENCOUNTER — Encounter: Payer: Self-pay | Admitting: Certified Nurse Midwife

## 2017-05-06 ENCOUNTER — Other Ambulatory Visit (HOSPITAL_COMMUNITY)
Admission: RE | Admit: 2017-05-06 | Discharge: 2017-05-06 | Disposition: A | Discharge status: Home / Self Care | Payer: PRIVATE HEALTH INSURANCE | Source: Ambulatory Visit | Attending: Certified Nurse Midwife | Admitting: Certified Nurse Midwife

## 2017-05-06 ENCOUNTER — Ambulatory Visit (INDEPENDENT_AMBULATORY_CARE_PROVIDER_SITE_OTHER): Payer: No Typology Code available for payment source | Admitting: Certified Nurse Midwife

## 2017-05-06 VITALS — BP 110/72 | HR 70 | Resp 16 | Ht 63.5 in | Wt 161.0 lb

## 2017-05-06 DIAGNOSIS — Z124 Encounter for screening for malignant neoplasm of cervix: Secondary | ICD-10-CM | POA: Diagnosis present

## 2017-05-06 DIAGNOSIS — Z3042 Encounter for surveillance of injectable contraceptive: Secondary | ICD-10-CM

## 2017-05-06 DIAGNOSIS — Z87898 Personal history of other specified conditions: Secondary | ICD-10-CM

## 2017-05-06 DIAGNOSIS — R87610 Atypical squamous cells of undetermined significance on cytologic smear of cervix (ASC-US): Secondary | ICD-10-CM

## 2017-05-06 DIAGNOSIS — Z8742 Personal history of other diseases of the female genital tract: Secondary | ICD-10-CM

## 2017-05-06 HISTORY — DX: Atypical squamous cells of undetermined significance on cytologic smear of cervix (ASC-US): R87.610

## 2017-05-06 MED ORDER — MEDROXYPROGESTERONE ACETATE 150 MG/ML IM SUSP
150.0000 mg | Freq: Once | INTRAMUSCULAR | Status: AC
  Administered 2017-05-06: 150 mg via INTRAMUSCULAR

## 2017-05-06 NOTE — Progress Notes (Signed)
.  31 y.o. G0P0000 SingleCaucasian  F here for repeat Pap smear due to history of CIN 3 history with LEEP and last pap with negative ECC and negative biopsy on colpo done 10/26/16. Patient denies any change in discharge or issues with Depo Provera use. No STD concerns or partner issues.Depo Provera due today, requests. "Much better place in my life today". No other health concerns.  No LMP recorded. Patient has had an injection..  Contraception:  EXAM: BP 110/72   Pulse 70   Resp 16   Ht 5' 3.5" (1.613 m)   Wt 161 lb (73 kg)   BMI 28.07 kg/m    General appearance:  WNWD Female, normal affect  Pelvic exam: normal external genitalia, no lesions VULVA: normal appearing vulva with no masses, tenderness or lesions,  VAGINA: normal appearing vagina with normal color and discharge, no lesions,  CERVIX: normal appearing cervix without discharge or lesions, LEEP appearance, pap smear obtained UTERUS: uterus is normal size, shape, consistency and nontender,  ADNEXA: normal adnexa in size, nontender and no masses,  RECTAL: normal appearance.   Assessment: History of CIN 3 with LEEP, last pap smear ASCUS with Colpo, negative for atypia, ECC negative  Contraception: Depo Provera desired due today  Plan: Repeat Pap 6 months at aex unless otherwise indicated by results. Discussed importance of follow up as indicated. Questions addressed. Depo Provera 150 mg IM due today  Rv aex/prn

## 2017-05-06 NOTE — Progress Notes (Signed)
Patient is here for Depo Provera Injection Patient is within Depo Provera Calender Limits Yes, 02/18/17 5/25 - 6/8 Next Depo Due between: 8/10 - 8/24 Last AEX: 10/15/16 DL AEX Scheduled: Not scheduled  Patient is aware when next depo is due  Pt tolerated Injection well. Injection given in RUOQ.

## 2017-05-12 LAB — CYTOLOGY - PAP
DIAGNOSIS: UNDETERMINED — AB
HPV (WINDOPATH): DETECTED — AB

## 2017-05-13 ENCOUNTER — Other Ambulatory Visit: Payer: Self-pay | Admitting: Certified Nurse Midwife

## 2017-05-13 DIAGNOSIS — R87810 Cervical high risk human papillomavirus (HPV) DNA test positive: Principal | ICD-10-CM

## 2017-05-13 DIAGNOSIS — R87610 Atypical squamous cells of undetermined significance on cytologic smear of cervix (ASC-US): Secondary | ICD-10-CM

## 2017-05-16 ENCOUNTER — Encounter: Payer: Self-pay | Admitting: *Deleted

## 2017-05-16 ENCOUNTER — Telehealth: Payer: Self-pay | Admitting: *Deleted

## 2017-05-16 NOTE — Telephone Encounter (Signed)
-----   Message from Verner Choleborah S Leonard, CNM sent at 05/13/2017  8:16 PM EDT ----- Notify patient that pap smear is ASCUS with positive HPVHR will need colposcopy. History of previous abnormal with Leep in 2011 Order placed,please schedule

## 2017-05-16 NOTE — Telephone Encounter (Signed)
Spoke with patient, advised of results and recommendations as seen below per Leota Sauerseborah Leonard, CNM. Patient states she does not have her work schedule with her, will need to return call for scheduling. Depo-provera and condoms for contraception, reports no cycles with depo. Patient verbalizes understanding and is agreeable.   Routing to PepsiCoDeborah Leonard, CNM FYI.

## 2017-05-25 NOTE — Telephone Encounter (Signed)
Left message to call Joslyne Marshburn at [phone].  

## 2017-06-07 NOTE — Telephone Encounter (Signed)
Spoke with patient. Patient is ready to schedule colposcopy at this time. Patient is on Depo and using condoms for contraception. Last depo injection was given 05/06/2017. Colposcopy appointment scheduled for 06/24/2017 at 2 pm with Leota Sauerseborah Leonard CNM. Patient is agreeable to date and time. Instructions given. Motrin 800 mg po x , one hour before appointment with food. Make sure to eat a meal before appointment and drink plenty of fluids. Patient verbalized understanding and will call to reschedule if will be on menses or has any concerns regarding pregnancy. Patient agreeable and verbalized understanding of all instructions.   Routing to provider for final review. Patient agreeable to disposition. Will close encounter.

## 2017-06-08 ENCOUNTER — Telehealth: Payer: Self-pay | Admitting: Certified Nurse Midwife

## 2017-06-08 ENCOUNTER — Other Ambulatory Visit: Payer: Self-pay | Admitting: *Deleted

## 2017-06-08 DIAGNOSIS — R87810 Cervical high risk human papillomavirus (HPV) DNA test positive: Principal | ICD-10-CM

## 2017-06-08 DIAGNOSIS — R87610 Atypical squamous cells of undetermined significance on cytologic smear of cervix (ASC-US): Secondary | ICD-10-CM

## 2017-06-08 NOTE — Telephone Encounter (Signed)
Patient returned call. Reviewed benefits for scheduled colposcopy. Patient understood and agreeable. Patient scheduled 06/24/17 with Leota Sauerseborah Leonard, CNM. Patient aware of date, arrival time and cancellation policy. No further questions. Ok to close

## 2017-06-08 NOTE — Telephone Encounter (Signed)
Call placed to patient review benefits for scheduled colposcopy. Left voicemail requesting a return call.

## 2017-06-24 ENCOUNTER — Encounter: Payer: Self-pay | Admitting: Certified Nurse Midwife

## 2017-06-24 ENCOUNTER — Ambulatory Visit (INDEPENDENT_AMBULATORY_CARE_PROVIDER_SITE_OTHER): Payer: No Typology Code available for payment source | Admitting: Certified Nurse Midwife

## 2017-06-24 VITALS — BP 92/60 | HR 64 | Resp 16 | Ht 63.5 in | Wt 162.0 lb

## 2017-06-24 DIAGNOSIS — R87810 Cervical high risk human papillomavirus (HPV) DNA test positive: Secondary | ICD-10-CM | POA: Diagnosis not present

## 2017-06-24 DIAGNOSIS — R87610 Atypical squamous cells of undetermined significance on cytologic smear of cervix (ASC-US): Secondary | ICD-10-CM

## 2017-06-24 NOTE — Progress Notes (Signed)
05-06-17 ASCUS HPV HR +, previous LEEP hx Pt took 1000mg  tylenol at 1pm

## 2017-06-24 NOTE — Patient Instructions (Signed)
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
## 2017-11-08 ENCOUNTER — Encounter: Payer: Self-pay | Admitting: Certified Nurse Midwife

## 2017-11-08 ENCOUNTER — Other Ambulatory Visit: Payer: Self-pay

## 2017-11-08 ENCOUNTER — Other Ambulatory Visit (HOSPITAL_COMMUNITY)
Admission: RE | Admit: 2017-11-08 | Discharge: 2017-11-08 | Disposition: A | Discharge status: Home / Self Care | Payer: PRIVATE HEALTH INSURANCE | Source: Ambulatory Visit | Attending: Certified Nurse Midwife | Admitting: Certified Nurse Midwife

## 2017-11-08 ENCOUNTER — Ambulatory Visit (INDEPENDENT_AMBULATORY_CARE_PROVIDER_SITE_OTHER): Payer: No Typology Code available for payment source | Admitting: Certified Nurse Midwife

## 2017-11-08 VITALS — BP 108/64 | HR 64 | Resp 16 | Ht 63.5 in | Wt 160.0 lb

## 2017-11-08 DIAGNOSIS — Z87898 Personal history of other specified conditions: Secondary | ICD-10-CM | POA: Diagnosis not present

## 2017-11-08 DIAGNOSIS — Z9889 Other specified postprocedural states: Secondary | ICD-10-CM

## 2017-11-08 DIAGNOSIS — N898 Other specified noninflammatory disorders of vagina: Secondary | ICD-10-CM

## 2017-11-08 DIAGNOSIS — Z3042 Encounter for surveillance of injectable contraceptive: Secondary | ICD-10-CM

## 2017-11-08 DIAGNOSIS — Z8742 Personal history of other diseases of the female genital tract: Secondary | ICD-10-CM

## 2017-11-08 DIAGNOSIS — Z124 Encounter for screening for malignant neoplasm of cervix: Secondary | ICD-10-CM

## 2017-11-08 DIAGNOSIS — Z01419 Encounter for gynecological examination (general) (routine) without abnormal findings: Secondary | ICD-10-CM | POA: Diagnosis not present
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
## 2017-11-08 NOTE — Patient Instructions (Addendum)
EXERCISE AND DIET:  We recommended that you start or continue a regular exercise program for good health. Regular exercise means any activity that makes your heart beat faster and makes you sweat.  We recommend exercising at least 30 minutes per day at least 3 days a week, preferably 4 or 5.  We also recommend a diet low in fat and sugar.  Inactivity, poor dietary choices and obesity can cause diabetes, heart attack, stroke, and kidney damage, among others.    ALCOHOL AND SMOKING:  Women should limit their alcohol intake to no more than 7 drinks/beers/glasses of wine (combined, not each!) per week. Moderation of alcohol intake to this level decreases your risk of breast cancer and liver damage. And of course, no recreational drugs are part of a healthy lifestyle.  And absolutely no smoking or even second hand smoke. Most people know smoking can cause heart and lung diseases, but did you know it also contributes to weakening of your bones? Aging of your skin?  Yellowing of your teeth and nails?  CALCIUM AND VITAMIN D:  Adequate intake of calcium and Vitamin D are recommended.  The recommendations for exact amounts of these supplements seem to change often, but generally speaking 600 mg of calcium (either carbonate or citrate) and 800 units of Vitamin D per day seems prudent. Certain women may benefit from higher intake of Vitamin D.  If you are among these women, your doctor will have told you during your visit.    PAP SMEARS:  Pap smears, to check for cervical cancer or precancers,  have traditionally been done yearly, although recent scientific advances have shown that most women can have pap smears less often.  However, every woman still should have a physical exam from her gynecologist every year. It will include a breast check, inspection of the vulva and vagina to check for abnormal growths or skin changes, a visual exam of the cervix, and then an exam to evaluate the size and shape of the uterus and  ovaries.  And after 32 years of age, a rectal exam is indicated to check for rectal cancers. We will also provide age appropriate advice regarding health maintenance, like when you should have certain vaccines, screening for sexually transmitted diseases, bone density testing, colonoscopy, mammograms, etc.   MAMMOGRAMS:  All women over 40 years old should have a yearly mammogram. Many facilities now offer a "3D" mammogram, which may cost around $50 extra out of pocket. If possible,  we recommend you accept the option to have the 3D mammogram performed.  It both reduces the number of women who will be called back for extra views which then turn out to be normal, and it is better than the routine mammogram at detecting truly abnormal areas.    COLONOSCOPY:  Colonoscopy to screen for colon cancer is recommended for all women at age 50.  We know, you hate the idea of the prep.  We agree, BUT, having colon cancer and not knowing it is worse!!  Colon cancer so often starts as a polyp that can be seen and removed at colonscopy, which can quite literally save your life!  And if your first colonoscopy is normal and you have no family history of colon cancer, most women don't have to have it again for 10 years.  Once every ten years, you can do something that may end up saving your life, right?  We will be happy to help you get it scheduled when you are ready.    Be sure to check your insurance coverage so you understand how much it will cost.  It may be covered as a preventative service at no cost, but you should check your particular policy.     Levonorgestrel intrauterine device (IUD) What is this medicine? LEVONORGESTREL IUD (LEE voe nor jes trel) is a contraceptive (birth control) device. The device is placed inside the uterus by a healthcare professional. It is used to prevent pregnancy. This device can also be used to treat heavy bleeding that occurs during your period. This medicine may be used for other  purposes; ask your health care provider or pharmacist if you have questions. COMMON BRAND NAME(S): Kyleena, LILETTA, Mirena, Skyla What should I tell my health care provider before I take this medicine? They need to know if you have any of these conditions: -abnormal Pap smear -cancer of the breast, uterus, or cervix -diabetes -endometritis -genital or pelvic infection now or in the past -have more than one sexual partner or your partner has more than one partner -heart disease -history of an ectopic or tubal pregnancy -immune system problems -IUD in place -liver disease or tumor -problems with blood clots or take blood-thinners -seizures -use intravenous drugs -uterus of unusual shape -vaginal bleeding that has not been explained -an unusual or allergic reaction to levonorgestrel, other hormones, silicone, or polyethylene, medicines, foods, dyes, or preservatives -pregnant or trying to get pregnant -breast-feeding How should I use this medicine? This device is placed inside the uterus by a health care professional. Talk to your pediatrician regarding the use of this medicine in children. Special care may be needed. Overdosage: If you think you have taken too much of this medicine contact a poison control center or emergency room at once. NOTE: This medicine is only for you. Do not share this medicine with others. What if I miss a dose? This does not apply. Depending on the brand of device you have inserted, the device will need to be replaced every 3 to 5 years if you wish to continue using this type of birth control. What may interact with this medicine? Do not take this medicine with any of the following medications: -amprenavir -bosentan -fosamprenavir This medicine may also interact with the following medications: -aprepitant -armodafinil -barbiturate medicines for inducing sleep or treating seizures -bexarotene -boceprevir -griseofulvin -medicines to treat seizures  like carbamazepine, ethotoin, felbamate, oxcarbazepine, phenytoin, topiramate -modafinil -pioglitazone -rifabutin -rifampin -rifapentine -some medicines to treat HIV infection like atazanavir, efavirenz, indinavir, lopinavir, nelfinavir, tipranavir, ritonavir -St. John's wort -warfarin This list may not describe all possible interactions. Give your health care provider a list of all the medicines, herbs, non-prescription drugs, or dietary supplements you use. Also tell them if you smoke, drink alcohol, or use illegal drugs. Some items may interact with your medicine. What should I watch for while using this medicine? Visit your doctor or health care professional for regular check ups. See your doctor if you or your partner has sexual contact with others, becomes HIV positive, or gets a sexual transmitted disease. This product does not protect you against HIV infection (AIDS) or other sexually transmitted diseases. You can check the placement of the IUD yourself by reaching up to the top of your vagina with clean fingers to feel the threads. Do not pull on the threads. It is a good habit to check placement after each menstrual period. Call your doctor right away if you feel more of the IUD than just the threads or if you cannot feel the   threads at all. The IUD may come out by itself. You may become pregnant if the device comes out. If you notice that the IUD has come out use a backup birth control method like condoms and call your health care provider. Using tampons will not change the position of the IUD and are okay to use during your period. This IUD can be safely scanned with magnetic resonance imaging (MRI) only under specific conditions. Before you have an MRI, tell your healthcare provider that you have an IUD in place, and which type of IUD you have in place. What side effects may I notice from receiving this medicine? Side effects that you should report to your doctor or health care  professional as soon as possible: -allergic reactions like skin rash, itching or hives, swelling of the face, lips, or tongue -fever, flu-like symptoms -genital sores -high blood pressure -no menstrual period for 6 weeks during use -pain, swelling, warmth in the leg -pelvic pain or tenderness -severe or sudden headache -signs of pregnancy -stomach cramping -sudden shortness of breath -trouble with balance, talking, or walking -unusual vaginal bleeding, discharge -yellowing of the eyes or skin Side effects that usually do not require medical attention (report to your doctor or health care professional if they continue or are bothersome): -acne -breast pain -change in sex drive or performance -changes in weight -cramping, dizziness, or faintness while the device is being inserted -headache -irregular menstrual bleeding within first 3 to 6 months of use -nausea This list may not describe all possible side effects. Call your doctor for medical advice about side effects. You may report side effects to FDA at 1-800-FDA-1088. Where should I keep my medicine? This does not apply. NOTE: This sheet is a summary. It may not cover all possible information. If you have questions about this medicine, talk to your doctor, pharmacist, or health care provider.  2018 Elsevier/Gold Standard (2016-09-10 14:14:56)  

## 2017-11-10 LAB — VAGINITIS/VAGINOSIS, DNA PROBE
Candida Species: NEGATIVE
Gardnerella vaginalis: NEGATIVE
Trichomonas vaginosis: NEGATIVE

## 2017-11-11 ENCOUNTER — Telehealth: Payer: Self-pay | Admitting: Certified Nurse Midwife

## 2017-11-11 DIAGNOSIS — Z30430 Encounter for insertion of intrauterine contraceptive device: Secondary | ICD-10-CM

## 2017-11-11 MED ORDER — MISOPROSTOL 200 MCG PO TABS: ORAL_TABLET | ORAL | 0 refills | Status: DC

## 2017-11-11 NOTE — Telephone Encounter (Signed)
Spoke with patient. Patient states that she wants to switch from Depo Provera to Paragard IUD. Discussed this with Leota Sauerseborah Leonard CNM. Next Depo is due 12/02/2017. Patient wants to have this done before the end of the year and has specific dates off work. Paragard insertion scheduled for 11/22/2017 at 10 am with Dr.Miller. Pre procedure instructions given.  Motrin instructions given. Motrin=Advil=Ibuprofen, 800 mg one hour before appointment. Eat a meal and hydrate well before appointment. Cytotec instructions given. Place 1 tablet PV night before the procedure. Place 1 tablet PV the morning of the procedure. Rx for Cytotec 200 mcg #2 0RF sent to pharmacy on file.  Routing to provider for final review regarding IUD insertion time frame with Depo.

## 2017-11-11 NOTE — Telephone Encounter (Signed)
This is fine.  Thanks.  Encounter closed. 

## 2017-11-11 NOTE — Telephone Encounter (Signed)
Patient is calling to schedule her IUD insertion with an MD. Patient has some questions about when to schedule.

## 2017-11-14 ENCOUNTER — Telehealth: Payer: Self-pay | Admitting: Certified Nurse Midwife

## 2017-11-14 NOTE — Telephone Encounter (Signed)
Patient requesting her results

## 2017-11-14 NOTE — Telephone Encounter (Signed)
Spoke with patient. Request to review pap results dated 11/08/17- advised normal per Kathleen Walters, CNM.   Patient request to review cytotec instructions for IUD placement, instructions reviewed, questions answered. Patient verbalizes understanding and is agreeable.   Routing to provider for final review. Patient is agreeable to disposition. Will close encounter.  Cc: Dr. Hyacinth MeekerMiller

## 2017-11-18 LAB — CYTOLOGY - PAP: DIAGNOSIS: NEGATIVE

## 2017-11-21 ENCOUNTER — Telehealth: Payer: Self-pay | Admitting: Obstetrics & Gynecology
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
## 2017-11-22 ENCOUNTER — Ambulatory Visit: Payer: No Typology Code available for payment source | Admitting: Obstetrics & Gynecology

## 2017-11-22 NOTE — Telephone Encounter (Signed)
Left message to call Kaitlyn at [phone]. 

## 2017-11-22 NOTE — Telephone Encounter (Signed)
Spoke with patient. Paragard insertion rescheduled to 11/29/2017 at 9 am with Dr.Miller. Patient is agreeable to date and time. Current form of birth control is Depo. Next Depo due 12/02/2017. Encounter closed.
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
## 2017-11-29 ENCOUNTER — Ambulatory Visit (INDEPENDENT_AMBULATORY_CARE_PROVIDER_SITE_OTHER): Payer: No Typology Code available for payment source | Admitting: Obstetrics & Gynecology

## 2017-11-29 ENCOUNTER — Encounter: Payer: Self-pay | Admitting: Obstetrics & Gynecology

## 2017-11-29 VITALS — BP 120/70 | HR 64 | Resp 16 | Ht 63.5 in | Wt 159.0 lb

## 2017-11-29 DIAGNOSIS — Z30430 Encounter for insertion of intrauterine contraceptive device: Secondary | ICD-10-CM | POA: Diagnosis not present

## 2017-11-29 DIAGNOSIS — Z202 Contact with and (suspected) exposure to infections with a predominantly sexual mode of transmission: Secondary | ICD-10-CM

## 2017-11-29 LAB — POCT URINE PREGNANCY: PREG TEST UR: NEGATIVE
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
## 2017-11-30 LAB — GC/CHLAMYDIA PROBE AMP
CHLAMYDIA, DNA PROBE: NEGATIVE
NEISSERIA GONORRHOEAE BY PCR: NEGATIVE

## 2017-12-09 ENCOUNTER — Ambulatory Visit: Payer: No Typology Code available for payment source | Admitting: Certified Nurse Midwife

## 2017-12-16 ENCOUNTER — Telehealth: Payer: Self-pay | Admitting: Obstetrics & Gynecology

## 2017-12-16 NOTE — Telephone Encounter (Signed)
Patient called and said she had an IUD placed on 11/29/17. She is concerned because she said she started her menstrual cycle on 12/03/17 and has been bleeding since then. Patient requesting to speak with the nurse.  Last seen: 11/29/17.

## 2017-12-16 NOTE — Telephone Encounter (Signed)
Spoke with patient. ParaGuard IUD placed on 11/29/17, menses started on 12/03/17 and has not stopped. Intermittent, mild menstrual like cramps. Alternates pad and tampons, changing 3 times per day. Denies any other symptoms.   Advised patient irregular cycles is not uncommon with new IUD, can take cycles at least 3 months to adjust, will likely improve with time.   Advised to continue to monitor. Should sever pain develop or  bleeding becomes heavy, changing pad/tampon q1-2 hours, return call to office or [facility].   Keep f/u scheduled with Dr. Hyacinth MeekerMiller, return call to office with any questions/concerns. Dr. Hyacinth MeekerMiller will review, I will return call with any additional recommendations. Patient is agreeable.   Dr. Hyacinth MeekerMiller -any additional recommendations?

## 2017-12-16 NOTE — Telephone Encounter (Signed)
Left detailed message, ok per current dpr. Advised as seen below per Dr. Hyacinth MeekerMiller. Advised to return call to schedule PUS if no significant improvement or with any additional questions/conerns. Will close encounter.

## 2017-12-16 NOTE — Telephone Encounter (Signed)
Please remind her that heavier and longer cycles are a common side effect.  However, would plan PUS next if doesn't significant improve.

## 2017-12-16 NOTE — Telephone Encounter (Signed)
Left message to call Manpreet Kemmer at [phone].  

## 2017-12-22 ENCOUNTER — Telehealth: Payer: Self-pay | Admitting: Obstetrics & Gynecology

## 2017-12-22 NOTE — Telephone Encounter (Signed)
Patient is still having issues with bleeding and now pain after iud insertion.

## 2017-12-22 NOTE — Telephone Encounter (Signed)
Spoke with patient. Patient states that she had Paragard IUD placed on 11/29/2017. Reports she has been having ongoing problems with bleeding since insertion. Per Dr.Miller PUS is next step if symptoms don't improve (see telephone note 12/16/2017). States she has had ongoing bleeding and last night when she had intercourse she had pain. Is now feeling very uncomfortable. Has constant pressure in pelvis. Has had light bleeding today. Denies any sharp pain, fever, or chills. Advised will need to be seen for PUS. Appointment scheduled for 12/27/2017 at 3 pm with 3:30 pm consult with Dr.Miller. Patient is agreeable to date and time. Aware if symptoms worsen or develops new symptoms needs to be seen locally in [place] for evaluation.  Routing to provider for final review. Patient agreeable to disposition. Will close encounter.

## 2017-12-22 NOTE — Telephone Encounter (Signed)
Left message to call Terez Montee at [phone]. 

## 2017-12-23 NOTE — Telephone Encounter (Signed)
Spoke with patient. Offered appointment for today with Dr.Miller for evaluation. Patient declines due to work schedule. Wants to keep appointment as schedule. Aware if symptoms return or worsen she will need to be seen in the office earlier or locally in [place]. Patient is agreeable.  Encounter previously closed.

## 2017-12-23 NOTE — Telephone Encounter (Signed)
Left message to call Johnnisha Forton at [phone]. 

## 2017-12-23 NOTE — Telephone Encounter (Signed)
Should pt come today?  Just wondering due to discomfort?

## 2017-12-26 ENCOUNTER — Other Ambulatory Visit: Payer: Self-pay | Admitting: *Deleted

## 2017-12-26 ENCOUNTER — Telehealth: Payer: Self-pay | Admitting: Obstetrics & Gynecology

## 2017-12-26 DIAGNOSIS — Z30431 Encounter for routine checking of intrauterine contraceptive device: Secondary | ICD-10-CM

## 2017-12-26 DIAGNOSIS — R102 Pelvic and perineal pain: Secondary | ICD-10-CM

## 2017-12-26 NOTE — Telephone Encounter (Signed)
Patient returned call to Suzy. °

## 2017-12-26 NOTE — Telephone Encounter (Signed)
Spoke with patient regarding benefit for scheduled ultrasound. Patient understood, see account notes for details. Patient scheduled 12/27/17 with Dr Hyacinth MeekerMiller. Patient aware of appointment date, arrival time and cancellation policy. No further questions. Ok to close

## 2017-12-26 NOTE — Telephone Encounter (Signed)
Call placed to patient to review benefit information for scheduled ultrasound. Left voicemail message requesting a return call °

## 2017-12-26 NOTE — Progress Notes (Signed)
.  tran

## 2017-12-27 ENCOUNTER — Ambulatory Visit (INDEPENDENT_AMBULATORY_CARE_PROVIDER_SITE_OTHER): Payer: No Typology Code available for payment source

## 2017-12-27 ENCOUNTER — Ambulatory Visit (INDEPENDENT_AMBULATORY_CARE_PROVIDER_SITE_OTHER): Payer: No Typology Code available for payment source | Admitting: Obstetrics & Gynecology

## 2017-12-27 VITALS — BP 120/70 | HR 96 | Resp 18 | Ht 63.5 in | Wt 157.0 lb

## 2017-12-27 DIAGNOSIS — Z30432 Encounter for removal of intrauterine contraceptive device: Secondary | ICD-10-CM | POA: Diagnosis not present

## 2017-12-27 DIAGNOSIS — R102 Pelvic and perineal pain: Secondary | ICD-10-CM

## 2017-12-27 DIAGNOSIS — Z30431 Encounter for routine checking of intrauterine contraceptive device: Secondary | ICD-10-CM | POA: Diagnosis not present

## 2017-12-27 DIAGNOSIS — N898 Other specified noninflammatory disorders of vagina: Secondary | ICD-10-CM | POA: Diagnosis not present

## 2017-12-27 MED ORDER — DOXYCYCLINE HYCLATE 100 MG PO CAPS: 100.0000 mg | ORAL_CAPSULE | Freq: Two times a day (BID) | ORAL | 0 refills | Status: DC

## 2017-12-27 NOTE — Progress Notes (Signed)
32 y.o. G0P0000 Single Caucasian female here for pelvic ultrasound due to pelvic cramping and irregular bleeding after Paraguard IUD placement.  Getting sick of the bleeding.  Really wants a solution to this.  Feels like the irregular bleeding has likely caused bacterial change and would like this tested today.  Cramping seems to be more bothersome as well.    No LMP recorded. Patient has had an injection.  Contraception: Paraguard IUD  Findings:  UTERUS: 7.3 x 4.1 x 3.0cm with IUD in lower uterine segment EMS:. 2.643mm ADNEXA: Left ovary: 2.5 x 2.0 x 1.5cm       Right ovary: 3.2 x 1.6 x 1.9cm CUL DE SAC: no free fluid  Discussion:  IUD clearly in abnormal location and removal indicated.  Possible placement of new IUD offered/recommended.  She is not sure that she wants this right now.  Would just like to see if bleeding resolves.  Consent for IUD removal obtained today.  PUS:  Speculum placed.  IUD string was about 4cm so clearly in the incorrect location. Ringed forceps used to grasped strings and remove IUD with one pull.  IUD discarded.  On BME, uterus mildly tender.  Affirm testing was obtained.  Pt tolerated procedure well.  Assessment:  DUB and pelvic cramping S/p IUD removal today H/O vaginal discharge Mild uterine tenderness  Plan:  Affirm pending.  Will await results before treating. Pt will contemplate if wants another IUD and let me know. Doxycycline 100mg  bid x 7 days Urine culture and affirm pending.  ~15 minutes spent with patient >50% of time was in face to face discussion of above.

## 2017-12-28 LAB — VAGINITIS/VAGINOSIS, DNA PROBE
CANDIDA SPECIES: NEGATIVE
GARDNERELLA VAGINALIS: NEGATIVE
TRICHOMONAS VAG: NEGATIVE

## 2017-12-30 ENCOUNTER — Encounter: Payer: Self-pay | Admitting: Obstetrics & Gynecology

## 2017-12-30 LAB — URINE CULTURE

## 2017-12-31 ENCOUNTER — Other Ambulatory Visit: Payer: Self-pay | Admitting: Obstetrics & Gynecology

## 2017-12-31 MED ORDER — SULFAMETHOXAZOLE-TRIMETHOPRIM 800-160 MG PO TABS: 1.0000 | ORAL_TABLET | Freq: Two times a day (BID) | ORAL | 0 refills | Status: DC

## 2018-01-02 ENCOUNTER — Telehealth: Payer: Self-pay | Admitting: *Deleted

## 2018-01-02 NOTE — Telephone Encounter (Signed)
LM for pt to call back.   Notes recorded by Jerene BearsMiller, Mary S, MD on 12/31/2017 at 9:05 AM EST  Called pt over the weekend. No answer. DPR reviewed. Left detailed message. Rx for bactrim DS BID x 3 days sent to pharmacy on file for her. Can you call on Monday and get update on bleeding and pain. Thanks.

## 2018-01-03 ENCOUNTER — Other Ambulatory Visit: Payer: Self-pay | Admitting: Obstetrics & Gynecology

## 2018-01-03 MED ORDER — NITROFURANTOIN MONOHYD MACRO 100 MG PO CAPS: 100.0000 mg | ORAL_CAPSULE | Freq: Two times a day (BID) | ORAL | 0 refills | Status: DC

## 2018-01-03 NOTE — Telephone Encounter (Signed)
Rx for macrobid 100mg  bid x 5 days to pharmacy.  She needs with onset of next cycle for IUD placement.  Will schedule with ultrasound.  Does she still want a Paragard?  Thanks.  I will place a paracervical block with the next IUD placement.  Does not need cytotec.  Thanks.

## 2018-01-03 NOTE — Telephone Encounter (Signed)
Patient called back. States she is not bleeding and not having any pelvic pain.    Patient states she wants to do another round of abx because she still has some pressure and feel like her UTI is not completely gone away.   She is also asking about appt for new IUD placement. Do you want her to wait until next period? And what does she need to do in prep for IUD?  Please advise.

## 2018-01-03 NOTE — Telephone Encounter (Signed)
Second attempt to contact patient. Left voicemail to call back  

## 2018-01-03 NOTE — Telephone Encounter (Signed)
Left voicemail for patient with message below

## 2018-01-20 ENCOUNTER — Ambulatory Visit: Payer: No Typology Code available for payment source | Admitting: Obstetrics & Gynecology

## 2018-02-14 ENCOUNTER — Telehealth: Payer: Self-pay | Admitting: Obstetrics & Gynecology

## 2018-02-14 DIAGNOSIS — Z30430 Encounter for insertion of intrauterine contraceptive device: Secondary | ICD-10-CM

## 2018-02-14 NOTE — Telephone Encounter (Signed)
Spoke with patient. Paragard IUD removed 12/27/17, waiting on menses to schedule reinsertion. SA, consistent condom use, "not comfortable with just condoms, its not 100%", requesting to schedule IUD insertion. No menses previously with depo provera, is concerned could be months.   Patient is aware of Dr. Garen LahMillers previous recommendations -schedule with menses, ultrasound guided and paracervical block. Patient requesting to review alternative options with Dr. Hyacinth MeekerMiller for scheduling. Home UPT neg 02/09/18.   Dr. Hyacinth MeekerMiller -please review.

## 2018-02-14 NOTE — Telephone Encounter (Signed)
Patient is ready to schedule her IUD insertion appointment. Patient said "I have not had a cycle and have had negative pregnancy test".

## 2018-02-15 ENCOUNTER — Telehealth: Payer: Self-pay | Admitting: Obstetrics & Gynecology

## 2018-02-15 MED ORDER — MISOPROSTOL 200 MCG PO TABS: ORAL_TABLET | ORAL | 0 refills | Status: DC

## 2018-02-15 NOTE — Telephone Encounter (Signed)
Spoke with patient, advised as seen below per Dr. Hyacinth MeekerMiller. Last SA on 02/12/18, requesting to schedule on a Friday, advised US not available in office on Fridays. Patient scheduled for US guided IUD insertion on 03/21/18 at 3:30pm with Dr. Hyacinth MeekerMiller, declined earlier appointments. Patient requesting Paragard IUD and paracervical block as previously discussed.   Reminded patient to abstain from intercourse for 2 wks, prior to appointment.   Rx for cytotec to pharmacy.   Order placed for IUD insertion.   Routing to provider for final review. Patient is agreeable to disposition. Will close encounter.  Cc: Harland DingwallSuzy Dixon

## 2018-02-15 NOTE — Telephone Encounter (Signed)
Spoke with patient. Patient wants to reschedule ultrasound guided IUD insertion. Appointment rescheduled to 03/02/2018 at 1 pm. Reminded patient to abstain from intercourse for 2 wks, prior to appointment.   Routing to provider for final review. Patient agreeable to disposition. Will close encounter.

## 2018-02-15 NOTE — Telephone Encounter (Signed)
Please review with pt last episode of SA.  From that date, she needs to abstain two weeks and will have a UPT when she comes to the office for insertion of the IUD.  Please do schedule with ultrasound guidance.  Thanks.  I do want to pre-treat with cytotec prior to procedure as well.  200mcg cytotec pv pm before and am of procedure.  Can cause cramping so use ibuprofen as needed and take 800mg  1 hour prior to procedure.

## 2018-02-15 NOTE — Telephone Encounter (Signed)
Patient is scheduled for IUD insertion on 4/9 and would like to speak with nurse about possibly moving it to 3/21.

## 2018-02-20 ENCOUNTER — Other Ambulatory Visit: Payer: Self-pay | Admitting: *Deleted

## 2018-02-20 DIAGNOSIS — Z30430 Encounter for insertion of intrauterine contraceptive device: Secondary | ICD-10-CM

## 2018-03-02 ENCOUNTER — Other Ambulatory Visit: Payer: No Typology Code available for payment source

## 2018-03-02 ENCOUNTER — Ambulatory Visit (INDEPENDENT_AMBULATORY_CARE_PROVIDER_SITE_OTHER): Payer: No Typology Code available for payment source

## 2018-03-02 ENCOUNTER — Other Ambulatory Visit: Payer: Self-pay

## 2018-03-02 ENCOUNTER — Ambulatory Visit (INDEPENDENT_AMBULATORY_CARE_PROVIDER_SITE_OTHER): Payer: No Typology Code available for payment source | Admitting: Obstetrics & Gynecology

## 2018-03-02 VITALS — BP 120/70 | HR 72 | Resp 16 | Ht 63.5 in | Wt 157.0 lb

## 2018-03-02 DIAGNOSIS — Z30430 Encounter for insertion of intrauterine contraceptive device: Secondary | ICD-10-CM | POA: Diagnosis not present

## 2018-03-02 DIAGNOSIS — Z01812 Encounter for preprocedural laboratory examination: Secondary | ICD-10-CM | POA: Diagnosis not present

## 2018-03-02 LAB — POCT URINE PREGNANCY: PREG TEST UR: NEGATIVE
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
## 2018-03-05 ENCOUNTER — Encounter: Payer: Self-pay | Admitting: Obstetrics & Gynecology

## 2018-03-21 ENCOUNTER — Other Ambulatory Visit: Payer: Self-pay | Admitting: Obstetrics & Gynecology

## 2018-03-21 ENCOUNTER — Other Ambulatory Visit: Payer: Self-pay

## 2018-04-10 ENCOUNTER — Telehealth: Payer: Self-pay | Admitting: Obstetrics & Gynecology

## 2018-04-10 NOTE — Telephone Encounter (Addendum)
Left detailed message per patient request. Advised as seen below per Dr. Hyacinth Meeker. Advised to return call to office to provide update or with any additional questions.

## 2018-04-10 NOTE — Telephone Encounter (Signed)
Patient is having heavy bleeding and severe cramping since Saturday. Had intercourse Friday night. Does not believe it is related to a cycle. Not interested in an ultrasound. Second IUD since December. Leave detailed messaged on cell if she does not answer.

## 2018-04-10 NOTE — Telephone Encounter (Signed)
Left message to call Kaetlin Bullen at [phone].  

## 2018-04-10 NOTE — Telephone Encounter (Signed)
Spoke with patient. Paragard IUD placed 03/02/18. Reports irregular bleeding since IUD placed, "heavy bleeding" and cramping after intercourse on 4/26. Patient describes bleeding as heavier than normal menses, changing saturated tampon q3 hrs. Reports pelvic pain is intermittent, resolves with motrin PRN. "This is not my normal". Denies N/V, fever/chills, or pain currently. Patient aware irregular bleeding can be common with new IUD, can take 3 months for menses to regulate.   Recommended OV for further evaluation. Declined OV for today,lives in [place],  states "I'm not coming in for another ultrasound either". Advised will review schedule with Dr. Hyacinth Meeker and return call. ER precautions reviewed.   Dr. Hyacinth Meeker -please review and advise?

## 2018-04-10 NOTE — Telephone Encounter (Signed)
IUD was placed under ultrasound guidance so I know this was in the correct location.  Can she feel her string?  If not, should be evaluated.  Can go to urgent care there.  Paraguard IUDs do typically causing increased bleeding.  It may be that an IUD is just not the best option for her and she really has tried.

## 2018-04-12 NOTE — Telephone Encounter (Signed)
Left message to call Kathleen Walters at [phone].  

## 2018-04-18 NOTE — Telephone Encounter (Signed)
Dr. Hyacinth Meeker -left detailed message previously, no return call, ok to close encounter?

## 2018-04-19 NOTE — Telephone Encounter (Signed)
Yes, ok to close encounter. 

## 2018-04-24 ENCOUNTER — Telehealth: Payer: Self-pay | Admitting: Obstetrics & Gynecology

## 2018-04-24 DIAGNOSIS — Z30432 Encounter for removal of intrauterine contraceptive device: Secondary | ICD-10-CM

## 2018-04-24 NOTE — Telephone Encounter (Signed)
Patient would like to have her IUD removed. Patient is only available this Tuesday of Thursday.

## 2018-04-24 NOTE — Telephone Encounter (Signed)
Left message to call Kathleen Walters at [phone].  

## 2018-04-24 NOTE — Telephone Encounter (Signed)
Spoke with patient, request to schedule ParaGard IUD removal for irregular menses. IUD removal schduled for 04/25/18 at 2pm with Dr. Hyacinth Meeker.   Order placed for IUD removal.  Routing to provider for final review. Patient is agreeable to disposition. Will close encounter.   Cc: Harland Dingwall

## 2018-04-25 ENCOUNTER — Other Ambulatory Visit: Payer: Self-pay

## 2018-04-25 ENCOUNTER — Encounter: Payer: Self-pay | Admitting: Obstetrics & Gynecology

## 2018-04-25 ENCOUNTER — Ambulatory Visit (INDEPENDENT_AMBULATORY_CARE_PROVIDER_SITE_OTHER): Payer: No Typology Code available for payment source | Admitting: Obstetrics & Gynecology

## 2018-04-25 DIAGNOSIS — Z30432 Encounter for removal of intrauterine contraceptive device: Secondary | ICD-10-CM
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
## 2018-04-27 ENCOUNTER — Encounter: Payer: Self-pay | Admitting: Obstetrics & Gynecology

## 2018-08-25 ENCOUNTER — Telehealth: Payer: Self-pay

## 2018-08-25 NOTE — Telephone Encounter (Signed)
Left message for patient to call & schedule yearly exam after 11-08-18

## 2018-08-25 NOTE — Telephone Encounter (Signed)
-----   Message from Joeseph Amorracy L Fast, RN sent at 08/25/2018  2:16 PM EDT ----- Regarding: 08 Recall  Patient in 08 recall for 10/2018. Needs annual exam scheduled.  Please schedule patient for annual exam, hx of LEEP, 05/06/17 PAP ASCUS with + HR HPV, s/p colpo. Thank you!

## 2018-08-30 NOTE — Telephone Encounter (Signed)
Left message for call back.

## 2018-09-01 NOTE — Telephone Encounter (Signed)
No callback from patient & no appt scheduled. Routing to [employer] Americanracy Fast, Insurance claims handlertriage nurse.

## 2018-11-21 ENCOUNTER — Other Ambulatory Visit: Payer: Self-pay

## 2018-11-21 ENCOUNTER — Other Ambulatory Visit (HOSPITAL_COMMUNITY)
Admission: RE | Admit: 2018-11-21 | Discharge: 2018-11-21 | Disposition: A | Discharge status: Home / Self Care | Payer: PRIVATE HEALTH INSURANCE | Source: Ambulatory Visit | Attending: Certified Nurse Midwife | Admitting: Certified Nurse Midwife

## 2018-11-21 ENCOUNTER — Encounter: Payer: Self-pay | Admitting: Certified Nurse Midwife

## 2018-11-21 ENCOUNTER — Ambulatory Visit (INDEPENDENT_AMBULATORY_CARE_PROVIDER_SITE_OTHER): Payer: No Typology Code available for payment source | Admitting: Certified Nurse Midwife

## 2018-11-21 VITALS — BP 104/62 | HR 64 | Resp 16 | Ht 63.5 in | Wt 152.0 lb

## 2018-11-21 DIAGNOSIS — Z124 Encounter for screening for malignant neoplasm of cervix: Secondary | ICD-10-CM | POA: Diagnosis not present

## 2018-11-21 DIAGNOSIS — Z8742 Personal history of other diseases of the female genital tract: Secondary | ICD-10-CM | POA: Diagnosis not present

## 2018-11-21 DIAGNOSIS — Z01419 Encounter for gynecological examination (general) (routine) without abnormal findings: Secondary | ICD-10-CM | POA: Diagnosis not present
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
## 2018-11-21 NOTE — Patient Instructions (Signed)

## 2018-11-24 LAB — CYTOLOGY - PAP
Diagnosis: UNDETERMINED — AB
HPV: DETECTED — AB

## 2018-11-26 ENCOUNTER — Other Ambulatory Visit: Payer: Self-pay | Admitting: Certified Nurse Midwife

## 2018-11-26 DIAGNOSIS — R87811 Vaginal high risk human papillomavirus (HPV) DNA test positive: Principal | ICD-10-CM

## 2018-11-26 DIAGNOSIS — R87620 Atypical squamous cells of undetermined significance on cytologic smear of vagina (ASC-US): Secondary | ICD-10-CM

## 2018-11-29 ENCOUNTER — Telehealth: Payer: Self-pay | Admitting: *Deleted

## 2018-11-29 NOTE — Telephone Encounter (Signed)
Ok to schedule will need UPT at appointment

## 2018-11-29 NOTE — Telephone Encounter (Signed)
-----   Message from Kathleen Walters, CNM sent at 11/26/2018  7:05 AM EST ----- Notify patient her pap smear showed ASCUS with positive HPV. Previous history of abnormal pap.  Will need colposcopy please schedule. Order placed.

## 2018-11-29 NOTE — Telephone Encounter (Signed)
Spoke with patient, advised as seen below per Leota Sauerseborah Leonard, CNM. Patient is familiar with colpo procedure. LMP 11/29/18. SA, condoms for contraceptive. Patient requesting to schedule 1/6 or 1/7 due to new insurance plan, will return call to office to provide new plan information. Advised patient to abstain from intercourse until colpo procedure. Advised I will review with Leota Sauerseborah Leonard, CNM and return call with any additional recommendations. Patient verbalizes understanding and is agreeable. See telephone encounter dated 11/29/18 to review with provider.   Order placed by DL for colposcopy for precert.    Leota Sauerseborah Leonard, CNM -ok to proceed with colpo as scheduled?   Cc: Harland DingwallSuzy Dixon, [place] Davis

## 2018-11-29 NOTE — Telephone Encounter (Signed)
Appointment notes updated for UPT day of procedure.   Encounter closed.

## 2018-12-04 ENCOUNTER — Telehealth: Payer: Self-pay | Admitting: Certified Nurse Midwife

## 2018-12-04 NOTE — Telephone Encounter (Signed)
Patient is returning a call to Suzy. °

## 2018-12-04 NOTE — Telephone Encounter (Signed)
Call placed to patient to advise we were unable to pre-certify appointment scheduled for 12/19/2018, with the [employer] insurance information the patient provided. Left voicemail message requesting a return call.

## 2018-12-19 ENCOUNTER — Ambulatory Visit (INDEPENDENT_AMBULATORY_CARE_PROVIDER_SITE_OTHER): Payer: 59 | Admitting: Certified Nurse Midwife

## 2018-12-19 ENCOUNTER — Encounter: Payer: Self-pay | Admitting: Certified Nurse Midwife

## 2018-12-19 ENCOUNTER — Other Ambulatory Visit: Payer: Self-pay

## 2018-12-19 VITALS — BP 120/80 | HR 72 | Resp 16 | Wt 156.0 lb

## 2018-12-19 DIAGNOSIS — Z9889 Other specified postprocedural states: Secondary | ICD-10-CM

## 2018-12-19 DIAGNOSIS — Z01812 Encounter for preprocedural laboratory examination: Secondary | ICD-10-CM

## 2018-12-19 DIAGNOSIS — R87811 Vaginal high risk human papillomavirus (HPV) DNA test positive: Secondary | ICD-10-CM

## 2018-12-19 DIAGNOSIS — R87620 Atypical squamous cells of undetermined significance on cytologic smear of vagina (ASC-US): Secondary | ICD-10-CM | POA: Diagnosis not present

## 2018-12-19 LAB — POCT URINE PREGNANCY: Preg Test, Ur: NEGATIVE

## 2018-12-19 NOTE — Progress Notes (Signed)
11-21-18 ASCUS HPV HR+ Previous abnormal history Pt took 800mg  ibuprofen at 1:15 upt-neg

## 2018-12-19 NOTE — Patient Instructions (Signed)
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
## 2018-12-27 ENCOUNTER — Telehealth: Payer: Self-pay | Admitting: *Deleted

## 2018-12-27 NOTE — Telephone Encounter (Signed)
-----   Message from Verner Chol, CNM sent at 12/26/2018  5:11 PM EST ----- Notify patient her biopsies from colposcopy showed no malignancy or dysplasia. Benign squamous mucosa on cervix and from ECC. Needs repeat pap in one year. Pap recall 08

## 2018-12-27 NOTE — Telephone Encounter (Signed)
Notes recorded by Leda Min, RN on 12/27/2018 at 11:31 AM EST Left message to call Noreene Larsson, RN at [facility] [phone].   08 recall

## 2018-12-27 NOTE — Telephone Encounter (Signed)
Left message to call Glorie Dowlen, RN at [facility] [phone].   

## 2018-12-27 NOTE — Telephone Encounter (Signed)
Patient returned call

## 2018-12-27 NOTE — Telephone Encounter (Signed)
Spoke with patient, advised per Leota Sauers, CNM. Patient declines to schedule AEX at this time, will call closer to time to schedule. 08 recall placed. Patient verbalizes understanding and is agreeable. Encounter closed.

## 2020-02-29 ENCOUNTER — Encounter: Payer: Self-pay | Admitting: Certified Nurse Midwife
